# Patient Record
Sex: Female | Born: 1990 | Race: White | Hispanic: No | Marital: Single | Smoking: Current every day smoker
Health system: Southern US, Community
[De-identification: ages and names within clinical notes are randomized; demographics above are authoritative.]

## PROBLEM LIST (undated history)

## (undated) DIAGNOSIS — Z8659 Personal history of other mental and behavioral disorders: Secondary | ICD-10-CM

## (undated) DIAGNOSIS — F329 Major depressive disorder, single episode, unspecified: Secondary | ICD-10-CM

## (undated) DIAGNOSIS — G43909 Migraine, unspecified, not intractable, without status migrainosus: Secondary | ICD-10-CM

## (undated) DIAGNOSIS — F32A Depression, unspecified: Secondary | ICD-10-CM

## (undated) DIAGNOSIS — F419 Anxiety disorder, unspecified: Secondary | ICD-10-CM

## (undated) HISTORY — DX: Anxiety disorder, unspecified: F41.9

## (undated) HISTORY — DX: Personal history of other mental and behavioral disorders: Z86.59

## (undated) HISTORY — DX: Major depressive disorder, single episode, unspecified: F32.9

## (undated) HISTORY — DX: Migraine, unspecified, not intractable, without status migrainosus: G43.909

## (undated) HISTORY — DX: Depression, unspecified: F32.A

---

## 2011-01-14 ENCOUNTER — Inpatient Hospital Stay (HOSPITAL_COMMUNITY)
Admission: EM | Admit: 2011-01-14 | Discharge: 2011-01-16 | DRG: 641 | Disposition: A | Discharge status: Home / Self Care | Payer: Managed Care, Other (non HMO) | Attending: Internal Medicine | Admitting: Internal Medicine

## 2011-01-14 DIAGNOSIS — R55 Syncope and collapse: Secondary | ICD-10-CM | POA: Diagnosis present

## 2011-01-14 DIAGNOSIS — R630 Anorexia: Secondary | ICD-10-CM | POA: Diagnosis present

## 2011-01-14 DIAGNOSIS — E860 Dehydration: Principal | ICD-10-CM | POA: Diagnosis present

## 2011-01-14 DIAGNOSIS — F1210 Cannabis abuse, uncomplicated: Secondary | ICD-10-CM | POA: Diagnosis present

## 2011-01-14 DIAGNOSIS — K219 Gastro-esophageal reflux disease without esophagitis: Secondary | ICD-10-CM | POA: Diagnosis present

## 2011-01-14 DIAGNOSIS — R000 Tachycardia, unspecified: Secondary | ICD-10-CM | POA: Diagnosis present

## 2011-01-14 DIAGNOSIS — R632 Polyphagia: Secondary | ICD-10-CM | POA: Diagnosis present

## 2011-01-14 DIAGNOSIS — E876 Hypokalemia: Secondary | ICD-10-CM | POA: Diagnosis present

## 2011-01-14 DIAGNOSIS — F17200 Nicotine dependence, unspecified, uncomplicated: Secondary | ICD-10-CM | POA: Diagnosis present

## 2011-01-14 DIAGNOSIS — F339 Major depressive disorder, recurrent, unspecified: Secondary | ICD-10-CM | POA: Diagnosis present

## 2011-01-14 DIAGNOSIS — N390 Urinary tract infection, site not specified: Secondary | ICD-10-CM | POA: Diagnosis present

## 2011-01-14 LAB — BASIC METABOLIC PANEL
Calcium: 8.8 mg/dL (ref 8.4–10.5)
Creatinine, Ser: 0.81 mg/dL (ref 0.4–1.2)
GFR calc Af Amer: >60 mL/min (ref >60)
GFR calc non Af Amer: >60 mL/min (ref >60)
Glucose, Bld: 97 mg/dL (ref 70–99)
Sodium: 132 mEq/L — ABNORMAL LOW (ref 135–145)

## 2011-01-14 LAB — DIFFERENTIAL
Basophils Absolute: 0 10*3/uL (ref 0.0–0.1)
Basophils Relative: 0 % (ref 0–1)
Eosinophils Relative: 0 % (ref 0–5)
Monocytes Absolute: 0.7 10*3/uL (ref 0.1–1.0)

## 2011-01-14 LAB — URINALYSIS, ROUTINE W REFLEX MICROSCOPIC
Bilirubin Urine: NEGATIVE
Nitrite: NEGATIVE
Protein, ur: 30 mg/dL — AB
Specific Gravity, Urine: 1.028 (ref 1.005–1.030)
Urobilinogen, UA: 1 mg/dL (ref 0.0–1.0)

## 2011-01-14 LAB — URINE MICROSCOPIC-ADD ON

## 2011-01-14 LAB — CBC
MCH: 32.2 pg (ref 26.0–34.0)
MCHC: 36.6 g/dL — ABNORMAL HIGH (ref 30.0–36.0)
RDW: 11.8 % (ref 11.5–15.5)

## 2011-01-14 LAB — D-DIMER, QUANTITATIVE: D-Dimer, Quant: 0.36 ug/mL-FEU (ref 0.00–0.48)

## 2011-01-15 ENCOUNTER — Emergency Department (HOSPITAL_COMMUNITY): Payer: Managed Care, Other (non HMO)

## 2011-01-15 ENCOUNTER — Encounter (HOSPITAL_COMMUNITY): Payer: Self-pay

## 2011-01-15 DIAGNOSIS — R55 Syncope and collapse: Secondary | ICD-10-CM

## 2011-01-15 DIAGNOSIS — F331 Major depressive disorder, recurrent, moderate: Secondary | ICD-10-CM

## 2011-01-15 LAB — RAPID URINE DRUG SCREEN, HOSP PERFORMED
Amphetamines: NOT DETECTED
Opiates: NOT DETECTED
Tetrahydrocannabinol: POSITIVE — AB

## 2011-01-15 LAB — URINE CULTURE
Colony Count: NO GROWTH
Culture: NO GROWTH

## 2011-01-15 LAB — CARDIAC PANEL(CRET KIN+CKTOT+MB+TROPI)
Relative Index: INVALID (ref 0.0–2.5)
Total CK: 81 U/L (ref 7–177)
Total CK: 76 U/L (ref 7–177)
Troponin I: 0.03 ng/mL (ref 0.00–0.06)

## 2011-01-15 LAB — COMPREHENSIVE METABOLIC PANEL
ALT: 14 U/L (ref 0–35)
AST: 27 U/L (ref 0–37)
Albumin: 2.4 g/dL — ABNORMAL LOW (ref 3.5–5.2)
Alkaline Phosphatase: 49 U/L (ref 39–117)
CO2: 28 mEq/L (ref 19–32)
Chloride: 101 mEq/L (ref 96–112)
GFR calc Af Amer: >60 mL/min (ref >60)
GFR calc non Af Amer: >60 mL/min (ref >60)
Potassium: 2.4 mEq/L — CL (ref 3.5–5.1)
Sodium: 133 mEq/L — ABNORMAL LOW (ref 135–145)
Total Bilirubin: 0.4 mg/dL (ref 0.3–1.2)

## 2011-01-15 LAB — CK TOTAL AND CKMB (NOT AT ARMC)
CK, MB: 0.4 ng/mL (ref 0.3–4.0)
Relative Index: INVALID (ref 0.0–2.5)
Total CK: 62 U/L (ref 7–177)

## 2011-01-15 LAB — CBC
Hemoglobin: 10.5 g/dL — ABNORMAL LOW (ref 12.0–15.0)
MCH: 32.3 pg (ref 26.0–34.0)
Platelets: 131 10*3/uL — ABNORMAL LOW (ref 150–400)
RBC: 3.25 MIL/uL — ABNORMAL LOW (ref 3.87–5.11)
WBC: 3 10*3/uL — ABNORMAL LOW (ref 4.0–10.5)

## 2011-01-15 LAB — TSH: TSH: 0.773 u[IU]/mL (ref 0.350–4.500)

## 2011-01-15 LAB — POTASSIUM: Potassium: 3.2 mEq/L — ABNORMAL LOW (ref 3.5–5.1)

## 2011-01-16 LAB — BASIC METABOLIC PANEL
BUN: 3 mg/dL — ABNORMAL LOW (ref 6–23)
CO2: 26 mEq/L (ref 19–32)
Calcium: 7.3 mg/dL — ABNORMAL LOW (ref 8.4–10.5)
Creatinine, Ser: 0.55 mg/dL (ref 0.4–1.2)
GFR calc non Af Amer: >60 mL/min (ref >60)
Glucose, Bld: 103 mg/dL — ABNORMAL HIGH (ref 70–99)
Sodium: 135 mEq/L (ref 135–145)

## 2011-01-16 LAB — CBC
Hemoglobin: 10.7 g/dL — ABNORMAL LOW (ref 12.0–15.0)
MCH: 31.4 pg (ref 26.0–34.0)
MCV: 88.9 fL (ref 78.0–100.0)
Platelets: 119 10*3/uL — ABNORMAL LOW (ref 150–400)
RBC: 3.41 MIL/uL — ABNORMAL LOW (ref 3.87–5.11)
WBC: 5 10*3/uL (ref 4.0–10.5)

## 2011-01-19 NOTE — Consult Note (Signed)
  Jenny Watts, ROSSA           ACCOUNT NO.:  [id number]  MEDICAL RECORD NO.:  [id number]           PATIENT TYPE:  I  LOCATION:  TC08                         FACILITY:  [facility]  PHYSICIAN:  Eulogio Ditch, MD DATE OF BIRTH:  07/28/1991  DATE OF CONSULTATION:  01/15/2011 DATE OF DISCHARGE:                                CONSULTATION   REASON FOR CONSULTATION:  History of depression.  HISTORY OF PRESENT ILLNESS:  A 20 year old white female with a long history of depression who was admitted to the medical floor because of syncope, UTI and hypokalemia.  The patient told me that she is on Celexa for 3 years.  Earlier she was on 20 and then it was increased to 40 mg. It is prescribed by the primary care physician.  The patient denied any previous psychiatric hospitalization or any history of suicide attempt. The patient told me that recently she is stressed out because she separated from her boyfriend.  She was with this boyfriend for the last approximately 2 years, but both were unhappy in this relationship so they are separated at this time.  The patient also reported stress at work and school.  She was going to school for [place], but not going at this time.  The patient also abuses marijuana, but not regularly, on and off.  She denies the use of any other drugs.  Drinks alcohol socially. The patient got counseling in the past but at this time she is not getting any counseling.  The patient lives with parents and a brother.  MENTAL STATUS EXAM:  Calm and cooperative during the interview.  Fair eye contact.  No psychomotor agitation or retardation noted during the interview.  Speech is soft and slow.  Mood is depressed.  Affect is mood- congruent.  Thought process logical and goal-directed.  Thought content not suicidal or homicidal, not delusional.  Thought perception, no audiovisual hallucination reported.  Not internally preoccupied. Cognition alert, awake and oriented x3.   Memory immediate, recent and remote intact.  Fund of knowledge fair.  Attention and concentration fair.  Abstraction ability good.  Insight and judgment intact.  DIAGNOSES:  AXIS I:  Major depressive disorder, recurrent type. AXIS II:  Deferred. AXIS III:  See medical notes. AXIS IV:  Chronic mental issues. AXIS V:  50.  RECOMMENDATIONS: 1. The patient can be continued on Celexa 40 mg p.o. daily. 2. I advised the patient to get counseling in the outpatient setting. 3. I will follow up on this patient as needed on the medical floor. 4. This patient does not need to be admitted to BSE in the inpatient     setting at this time. 5. Mother agrees with the treatment plan. 6. I advised the patient not to abuse marijuana.     Eulogio Ditch, MD     SA/MEDQ  D:  01/15/2011  T:  01/15/2011  Job:  [id number]  Electronically Signed by Eulogio Ditch  on 01/19/2011 06:08:25 AM

## 2011-01-23 NOTE — Discharge Summary (Signed)
Jenny Watts, Jenny Watts           ACCOUNT NO.:  [id number]  MEDICAL RECORD NO.:  [id number]           PATIENT TYPE:  I  LOCATION:  TC08                         FACILITY:  [facility]  PHYSICIAN:  Thad Ranger, MD       DATE OF BIRTH:  1991/04/27  DATE OF ADMISSION:  01/14/2011 DATE OF DISCHARGE:  01/16/2011                              DISCHARGE SUMMARY   PRIMARY CARE PHYSICIAN:  [facility].  DISCHARGE DIAGNOSES: 1. Syncopal episode. 2. Hypokalemia. 3. Hypomagnesemia. 4. Tachycardia. 5. History of depression with bulimia anorexia. 6. Gastroesophageal reflux disease. 7. Cannabis abuse. 8. Urinary tract infection. 9. Dehydration.  CONSULTATIONS:  Psychiatry, Eulogio Ditch, MD  MEDICATIONS:  At the time of discharge, 1. Ciprofloxacin 500 mg p.o. b.i.d. for 2 days. 2. Magnesium oxide 400 mg p.o. b.i.d. for 7 days. 3. Potassium chloride 40 mEq p.o. daily for 3 days. 4. Protonix 40 mg p.o. daily. 5. Citalopram 40 mg p.o. daily at bedtime. 6. Multivitamin 1 tablet p.o. daily at bedtime.  BRIEF HISTORY OF PRESENT ILLNESS:  At the time of admission, Ms. Ibsen is a 20 year old female with history of depression, apparently went to work, sit up for a minute, looked at her boss, and then had a syncopal episode.  The patient felt lightheaded prior to the syncope and she knew that she was going to syncopize.  For details, please refer to the admission note dictated by Dr. Pearson Grippe on January 14, 2011.  Also at the time of admission, the patient was noted to have extremely low potassium as well as UTI.  RADIOLOGICAL DATA:  Chest x-ray, February 15th, no acute findings.  CT head without contrast, February 15th, normal study.  Echocardiogram, February 15th, EF of 55% to 60%, normal wall motion, no regional wall motion abnormalities.  PERTINENT LABORATORY AND DIAGNOSTIC DATA:  Urine pregnancy test negative.  CBC at the time of admission was essentially  unremarkable. UA showed more than 80 ketones,, 7-10 WBCs with many bacteria.  BMET at the time of admission showed potassium less than 2, sodium 132, BUN 10, creatinine 0.8, magnesium was 1.6.  D-dimer 0.36.  Alcohol level less than 5.  Urine drug screen showed positive tetrahydrocannabinol.  TSH 0.773.  Urine culture showed no growth.  BMET at the time of the discharge, sodium has improved to 135, potassium 3.0 which is being replaced, magnesium 1.4 which is also being replaced.  BRIEF HOSPITALIZATION COURSE:  Ms. Milhoan is a 20 year old female who presented with syncopal episode. 1. Syncope, multifactorial, likely secondary to dehydration and     vasovagal, electrolyte abnormalities.  The patient was admitted to     the tele monitor and ruled out for acute ACS.  CT of the brain was     done which showed normal study.  Echocardiogram also essentially     was normal.  The patient did have multiple abnormalities which     included hypomagnesemia and hypokalemia which was aggressively     replaced.  The patient was strongly counseled on not using any     diuretics or laxatives for her bulimia. 2. Depression with bulimia and anorexia.  Psychiatry consultation was     obtained and the patient was continued on Celexa.  The patient was     strongly counseled on not using the laxatives and diuretics over     the counter and also see outpatient psychiatry. 3. UTI.  The patient was placed on ciprofloxacin and should continue     for 2 more days.  PHYSICAL EXAMINATION:  GENERAL:  The patient is alert, awake, and oriented x3, not in any acute distress. HEENT:  Anicteric sclerae and conjunctivae.  Pupil reactive to light and accommodation.  EOMI. NECK:  Supple.  No lymphopathy.  No JVD. CVS:  S1 and S2 clear. CHEST:  Clear to auscultation bilaterally. ABDOMEN:  Soft, nontender, nondistended, normal bowel sounds. EXTREMITIES:  No cyanosis, clubbing, or edema noted in upper or  lower extremities bilaterally.  Discharge follow up with [facility] within next week and also get a BMET level next week with her followup.  Vital signs,  stable at the time of discharge.  DISCHARGE TIME:  35 minutes.     Thad Ranger, MD     RR/MEDQ  D:  01/16/2011  T:  01/16/2011  Job:  [id number]  cc:   [facility]  Electronically Signed by RIPUDEEP RAI  on 01/23/2011 12:43:14 PM

## 2011-02-18 NOTE — H&P (Signed)
Jenny Watts, Jenny Watts           ACCOUNT NO.:  [id number]  MEDICAL RECORD NO.:  [id number]           PATIENT TYPE:  E  LOCATION:  WLED                         FACILITY:  [facility]  PHYSICIAN:  Massie Maroon, MD        DATE OF BIRTH:  1991-08-27  DATE OF ADMISSION:  01/14/2011 DATE OF DISCHARGE:                             HISTORY & PHYSICAL   CHIEF COMPLAINT:  "I just was not feeling well."  HISTORY OF PRESENT ILLNESS:  This is a 20 year old female with a history of depression, apparently went to work, stood up for a minute, looked at her boss and then apparently had an episode of syncope.  She felt lightheaded prior to syncope and she knew that she was going to syncopize.  Apparently, she may have hit her head or at least she was told so by her colleagues.  There is no sign or evidence of laceration at the present time.  The patient is alert and oriented x3.  The patient denied any presyncopal episodes other than being lightheaded such as chest pain, palpitations, shortness of breath, nausea or vomiting.  The patient has noted some slight diarrhea over the last 4 days.  She also admits to making herself vomit.  The patient came to the ER for evaluation.  CT brain is pending.  The patient will be admitted for workup of syncope.  Of note, potassium was extremely low and this may have had a role in her episode of syncope as well as the fact that she has a urinary tract infection.  The patient will be admitted for syncope, UTI and hypokalemia.  PAST MEDICAL HISTORY: 1. Depression. 2. GERD.  PAST SURGICAL HISTORY:  None.  SOCIAL HISTORY:  Cannabis abuse, smoker of 1 pack per day for 2 years. She occasionally drinks.  She last drank on Friday.  She works at [employer].  FAMILY HISTORY:  Mother is alive at age 76 and has depression.  Father is alive at age 4 and healthy.  One brother is alive and healthy.  REVIEW OF SYSTEMS:  Positive for drinking alcohol on Friday,  otherwise negative for all 10 organ systems except for pertinent positives stated above.  ALLERGIES:  No known drug allergies.  MEDICATIONS: 1. Citalopram 40 mg p.o. daily. 2. Multivitamin 1 p.o. daily.  REVIEW OF SYSTEMS:  Negative for all 10 organ systems except for pertinent positives stated above.  PHYSICAL EXAMINATION:  VITAL SIGNS:  Temperature 98.3, pulse 104, blood pressure 105/71, pulse oximetry 97% on room air. HEENT:  Anicteric, EOMI, no nystagmus.  Pupils 1.5 mm, symmetric. Direct consensual near reflexes intact.  Mucous membranes moist. NECK:  No JVD, no bruit, no thyromegaly, no adenopathy. HEART:  Regular rate and rhythm.  S1 and S2.  No murmurs, gallops or rubs. LUNGS:  Clear to auscultation bilaterally. ABDOMEN:  Soft, nontender, nondistended.  Positive bowel sounds. EXTREMITIES:  No cyanosis, clubbing or edema. SKIN:  No rashes. LYMPH NODES:  No adenopathy. NEUROLOGIC:  Nonfocal.  Cranial nerves II through XII intact.  Reflexes 2+, symmetric, diffuse with downgoing toes bilaterally, motor strength 5/5 in all 4 extremities, pinprick intact.  SKIN:  Slight reddish marks around the nail beds indicative of possibly trying to make herself vomit.  LABORATORY DATA:  Sodium 132, potassium less than 2, chloride 81, bicarb 39, BUN 10, creatinine 0.81.  Urinalysis, WBC 7-10.  WBCs 5.7, hemoglobin 13.0, platelet count 181, urine pregnancy test is negative.  ASSESSMENT: 1. Syncope. 2. Hypokalemia. 3. Tachycardia. 4. Tobacco dependence. 5. Gastroesophageal reflux disease. 6. Cannabis abuse. 7. Depression.  PLAN:  The patient will be placed on telemetry.  Because of syncope, will check CK, CK-MB, troponin-I q.6 hours x3 sets along with carotid ultrasound, cardiac 2-D echo and a CT of the brain.  Have a D-dimer to the ED labs and if positive, CT angio, chest.  Rule out PE.  We will also check a CT brain, noncontrast.  The patient will be started on Cipro 500 mg p.o.  b.i.d. for UTI.  We will replete her potassium.  Check a magnesium and if low, replete her magnesium as this is needed to reabsorb potassium.  Obviously, she is instructed not to make herself throw up.  Continue citalopram 40 mg p.o. daily.  Continue multivitamins.  DVT prophylaxis, SCDs.     Massie Maroon, MD     JYK/MEDQ  D:  01/14/2011  T:  01/14/2011  Job:  [id number]  Electronically Signed by Pearson Grippe MD on 02/18/2011 09:52:42 PM

## 2011-08-08 IMAGING — CT CT HEAD W/O CM
1 series · 16 of 30 positions shown, 20 images · non-contrast
Comparison: None.

CLINICAL DATA: Syncope.

CT HEAD WITHOUT CONTRAST
TECHNIQUE: Contiguous axial images were obtained from the base of
the skull through the vertex without contrast.

[Series 2: headseq 4.8 h45s · axial · 0.43mm/px · z∈[-121,+7]mm · 16 of 30 slices shown, 20 images]
[im 2/30  brain]
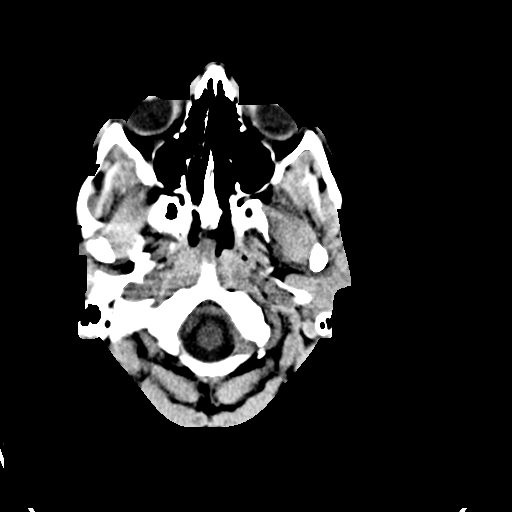
[im 2/30  bone]
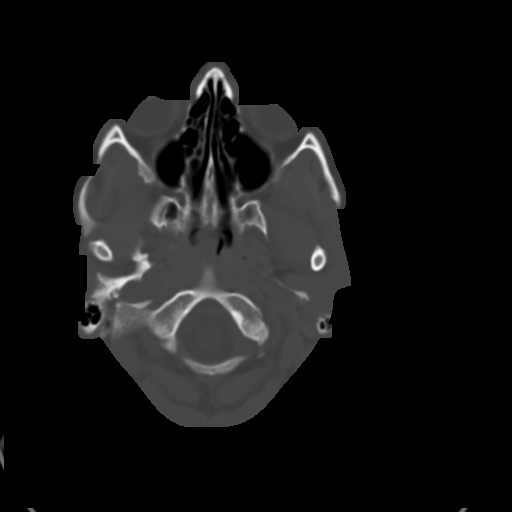
[im 4/30  brain]
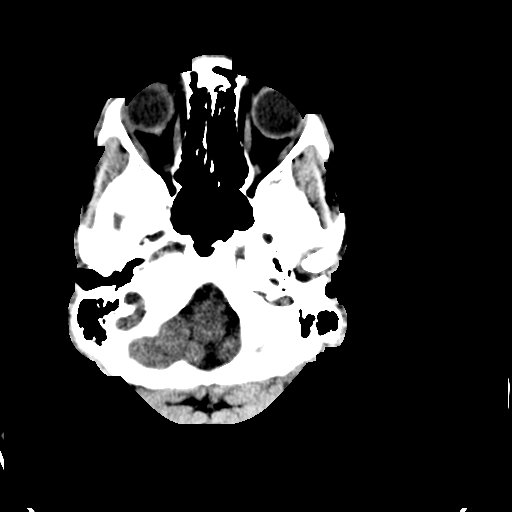
[im 6/30  brain]
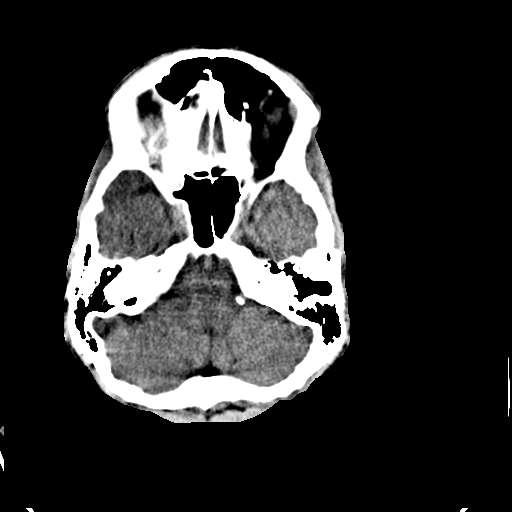
[im 8/30  brain]
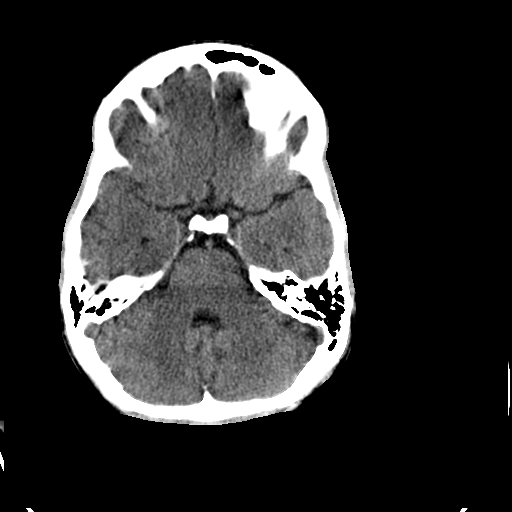
[im 9/30  brain]
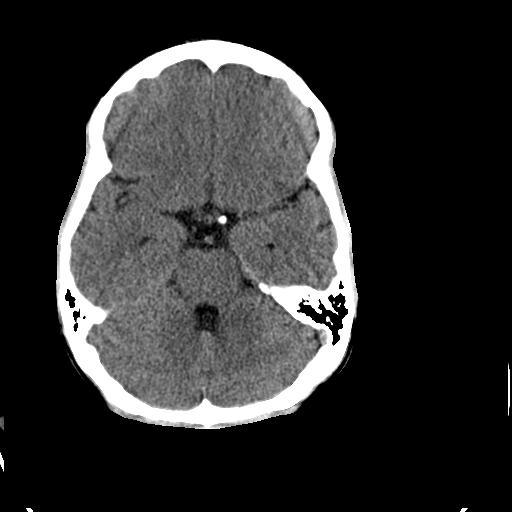
[im 9/30  bone]
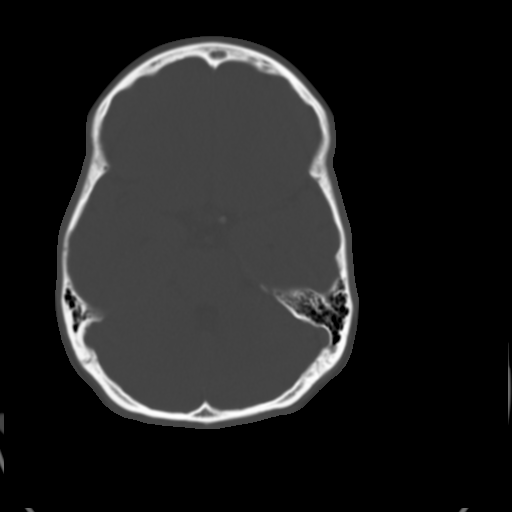
[im 11/30  brain]
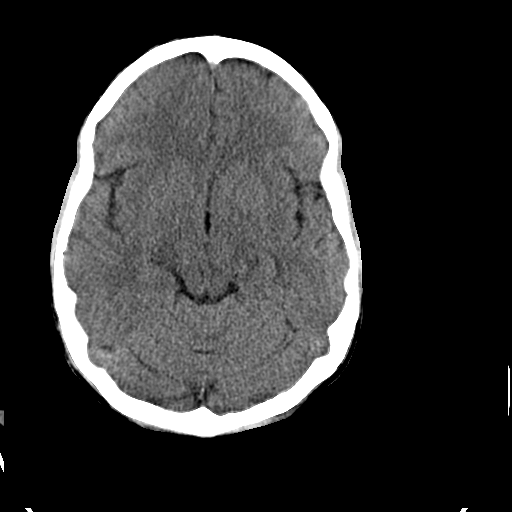
[im 13/30  brain]
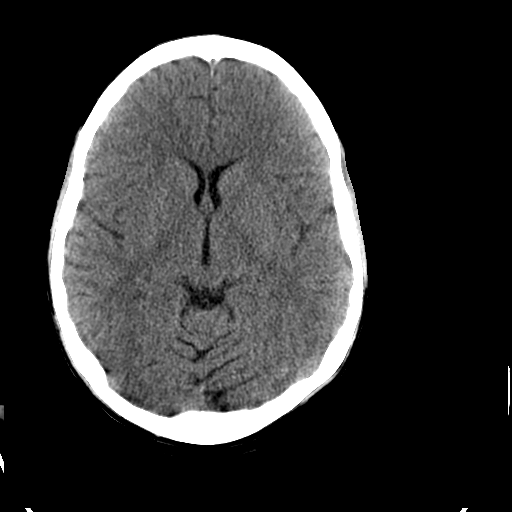
[im 15/30  brain]
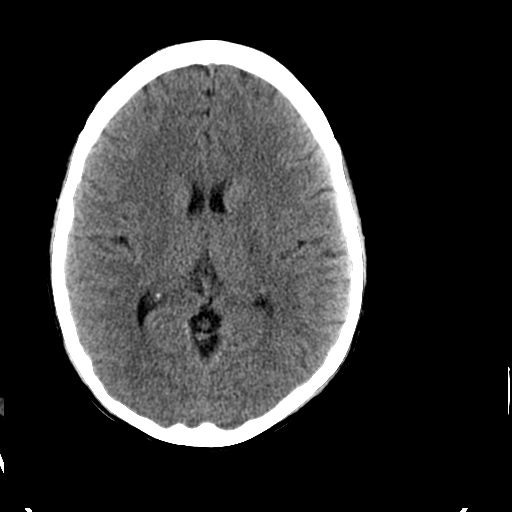
[im 16/30  brain]
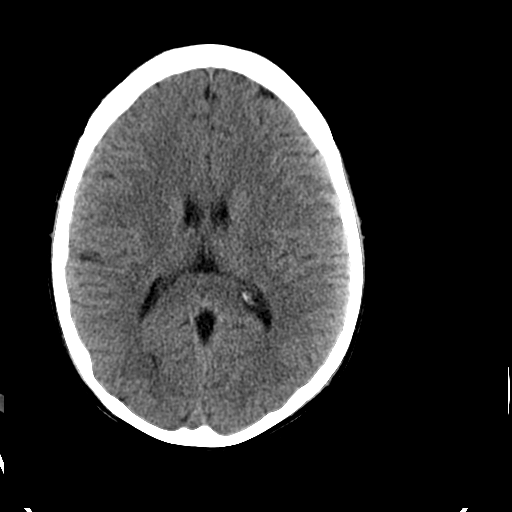
[im 16/30  bone]
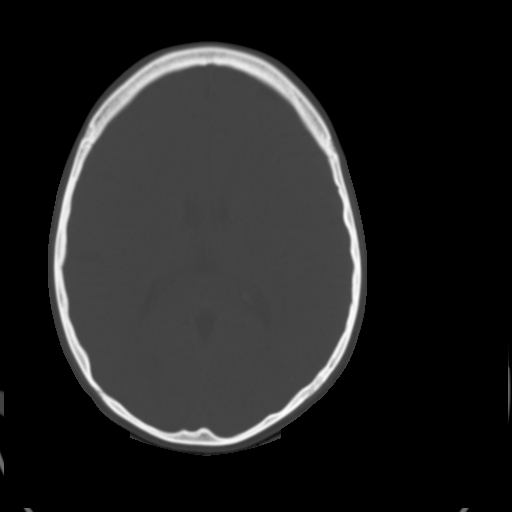
[im 18/30  brain]
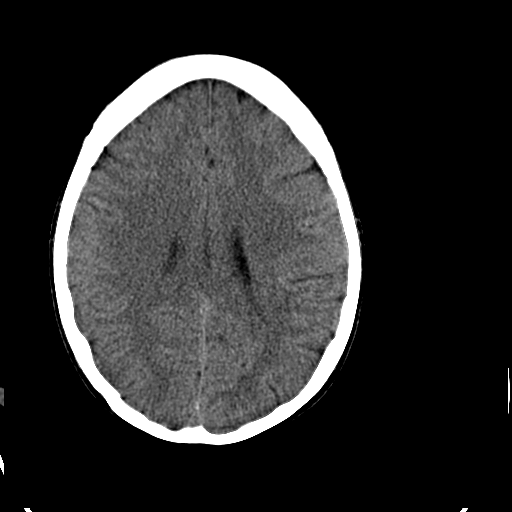
[im 20/30  brain]
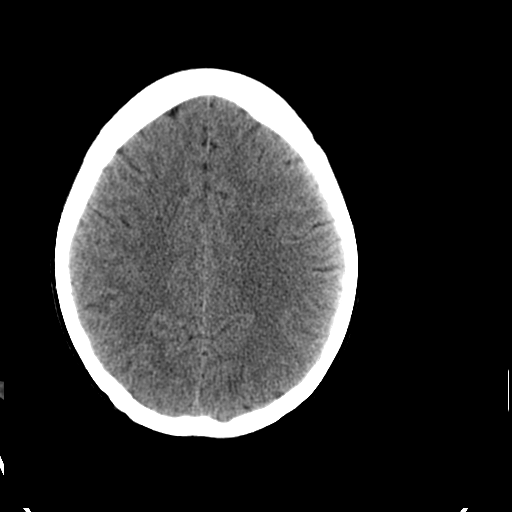
[im 22/30  brain]
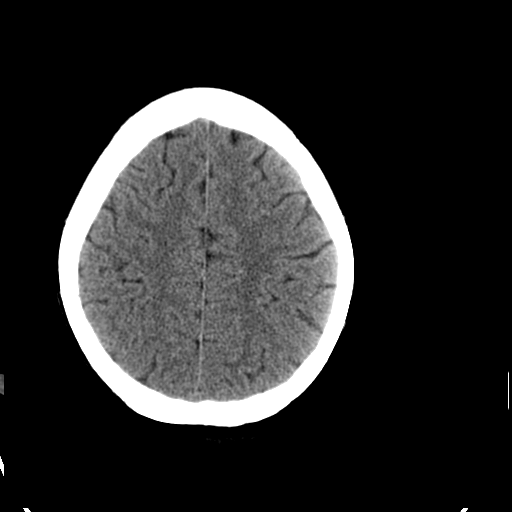
[im 23/30  brain]
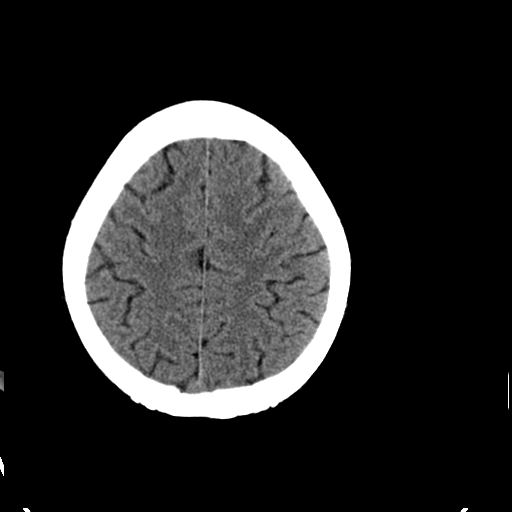
[im 23/30  bone]
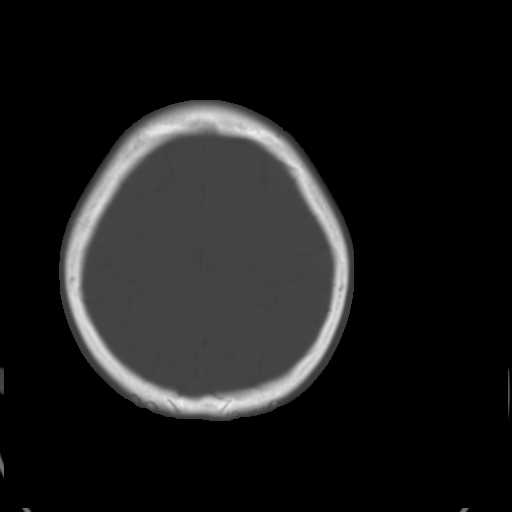
[im 25/30  brain]
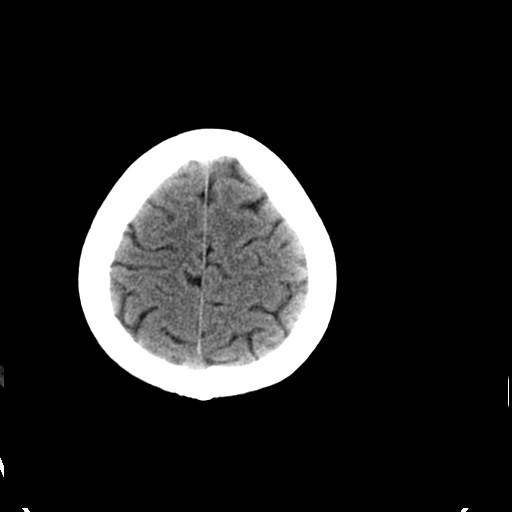
[im 27/30  brain]
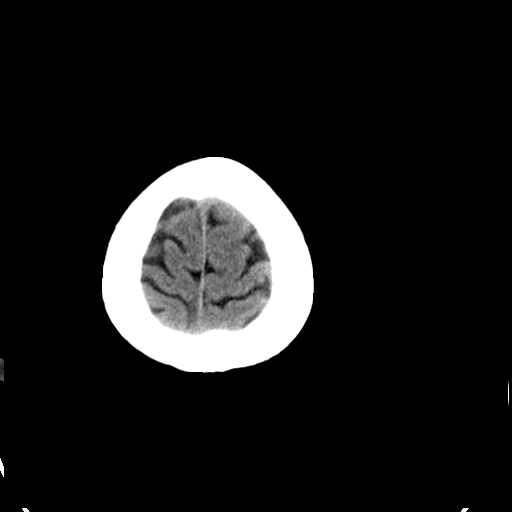
[im 29/30  brain]
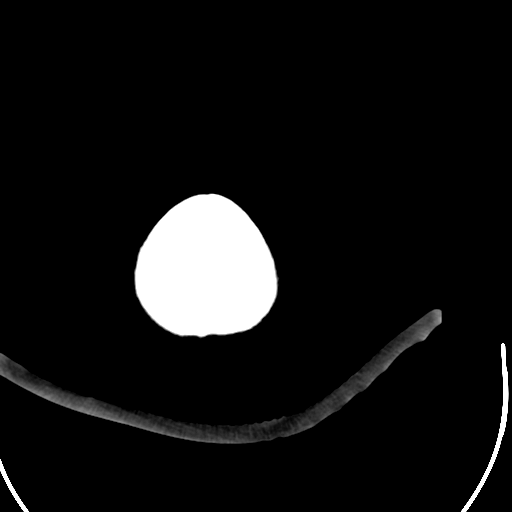

[16 of 30 positions shown; findings below may reference images not displayed]

FINDINGS: The brain appears normal without evidence of acute
infarction, hemorrhage, mass lesion, mass effect, midline shift or
abnormal extra-axial fluid collection.  No hydrocephalus.  Imaged
paranasal sinuses and mastoid air cells are clear.  Calvarium
intact.
IMPRESSION: Normal study.

## 2014-11-14 ENCOUNTER — Ambulatory Visit (INDEPENDENT_AMBULATORY_CARE_PROVIDER_SITE_OTHER): Payer: Managed Care, Other (non HMO) | Admitting: Family Medicine

## 2014-11-14 ENCOUNTER — Encounter: Payer: Self-pay | Admitting: Family Medicine

## 2014-11-14 VITALS — BP 119/83 | HR 104 | Ht 60.0 in | Wt 109.6 lb

## 2014-11-14 DIAGNOSIS — F324 Major depressive disorder, single episode, in partial remission: Secondary | ICD-10-CM

## 2014-11-14 DIAGNOSIS — R519 Headache, unspecified: Secondary | ICD-10-CM | POA: Insufficient documentation

## 2014-11-14 DIAGNOSIS — Z8659 Personal history of other mental and behavioral disorders: Secondary | ICD-10-CM

## 2014-11-14 DIAGNOSIS — Z124 Encounter for screening for malignant neoplasm of cervix: Secondary | ICD-10-CM

## 2014-11-14 DIAGNOSIS — Z113 Encounter for screening for infections with a predominantly sexual mode of transmission: Secondary | ICD-10-CM

## 2014-11-14 DIAGNOSIS — Z01419 Encounter for gynecological examination (general) (routine) without abnormal findings: Secondary | ICD-10-CM

## 2014-11-14 DIAGNOSIS — N926 Irregular menstruation, unspecified: Secondary | ICD-10-CM

## 2014-11-14 DIAGNOSIS — F325 Major depressive disorder, single episode, in full remission: Secondary | ICD-10-CM

## 2014-11-14 DIAGNOSIS — R51 Headache: Secondary | ICD-10-CM

## 2014-11-14 DIAGNOSIS — F419 Anxiety disorder, unspecified: Secondary | ICD-10-CM

## 2014-11-14 LAB — POCT URINE PREGNANCY: PREG TEST UR: NEGATIVE

## 2014-11-14 MED ORDER — NORGESTIMATE-ETH ESTRADIOL 0.25-35 MG-MCG PO TABS: 1.0000 | ORAL_TABLET | Freq: Every day | ORAL | Status: DC

## 2014-11-14 NOTE — Progress Notes (Signed)
Patient is here for her first pap exam.  She is interested in starting birth control to help control her cycles and has had some increased white discharge that she would like to have checked out.  She is also requesting a pregnancy test due to her last cycle was extremely light, lasting three days when they are usually 5-8 days.

## 2014-11-14 NOTE — Assessment & Plan Note (Signed)
Under more stress lately--continue Celexa--may return to see [name] if needed.

## 2014-11-14 NOTE — Progress Notes (Signed)
Subjective:     Jenny Watts is a 23 y.o. female and is here for a comprehensive physical exam. The patient reports problems - irregular cycles.  Has h/o amenorrhea related to eating disorder.  Now cycles are every 5 wks and last a full week.  She is sexually active. One female partner. Reports increased headaches lately, but under more stress. Interested in contraception for birth control and for regulation of cycles.  She uses condoms regularly.   History   Social History  . Marital Status: Single    Spouse Name: N/A    Number of Children: N/A  . Years of Education: N/A   Occupational History  . Not on file.   Social History Main Topics  . Smoking status: Current Every Day Smoker -- 0.25 packs/day for 6 years    Types: Cigarettes  . Smokeless tobacco: Never Used  . Alcohol Use: 0.0 oz/week    0 Not specified per week     Comment: weekends  . Drug Use: No  . Sexual Activity:    Partners: Male    Birth Control/ Protection: Condom   Other Topics Concern  . Not on file   Social History Narrative   Period Cycle (Days): 35 Period Duration (Days): 7 d Period Pattern: (!) Irregular Dysmenorrhea: (!) Moderate  The following portions of the patient's history were reviewed and updated as appropriate: allergies, current medications, past family history, past medical history, past social history, past surgical history and problem list.  Review of Systems A comprehensive review of systems was negative.   Objective:    BP 119/83 mmHg  Pulse 104  Ht 5' (1.524 m)  Wt 109 lb 9.6 oz (49.714 kg)  BMI 21.40 kg/m2  LMP 10/31/2014 (Approximate) General appearance: alert, cooperative and appears stated age Head: Normocephalic, without obvious abnormality, atraumatic Neck: no adenopathy, supple, symmetrical, trachea midline and thyroid not enlarged, symmetric, no tenderness/mass/nodules Lungs: clear to auscultation bilaterally Breasts: normal appearance, no masses or  tenderness Heart: regular rate and rhythm, S1, S2 normal, no murmur, click, rub or gallop Abdomen: soft, non-tender; bowel sounds normal; no masses,  no organomegaly Pelvic: cervix normal in appearance, external genitalia normal, no adnexal masses or tenderness, no cervical motion tenderness, uterus normal size, shape, and consistency and vagina normal without discharge Extremities: extremities normal, atraumatic, no cyanosis or edema Pulses: 2+ and symmetric Skin: Skin color, texture, turgor normal. No rashes or lesions Lymph nodes: Cervical, supraclavicular, and axillary nodes normal. Neurologic: Grossly normal    Assessment:    Healthy female exam.      Plan:   Problem List Items Addressed This Visit      Unprioritized   Depression, major, in remission   Relevant Medications      citalopram (CELEXA) 40 MG tablet   Worsening headaches    Under more stress lately--continue Celexa--may return to see [name] if needed.    Relevant Medications      citalopram (CELEXA) 40 MG tablet   Anxiety   Relevant Medications      citalopram (CELEXA) 40 MG tablet   History of anorexia nervosa    Other Visit Diagnoses    Screening for malignant neoplasm of cervix    -  Primary    Relevant Orders       Cytology - PAP       HIV antibody       RPR    Irregular menstrual bleeding        Relevant Medications  Sprintec 1 tab PO daily    Other Relevant Orders       POCT urine pregnancy (Completed)         See After Visit Summary for Counseling Recommendations

## 2014-11-14 NOTE — Patient Instructions (Signed)
Contraception Choices Contraception (birth control) is the use of any methods or devices to prevent pregnancy. Below are some methods to help avoid pregnancy. HORMONAL METHODS   Contraceptive implant. This is a thin, plastic tube containing progesterone hormone. It does not contain estrogen hormone. Your health care provider inserts the tube in the inner part of the upper arm. The tube can remain in place for up to 3 years. After 3 years, the implant must be removed. The implant prevents the ovaries from releasing an egg (ovulation), thickens the cervical mucus to prevent sperm from entering the uterus, and thins the lining of the inside of the uterus.  Progesterone-only injections. These injections are given every 3 months by your health care provider to prevent pregnancy. This synthetic progesterone hormone stops the ovaries from releasing eggs. It also thickens cervical mucus and changes the uterine lining. This makes it harder for sperm to survive in the uterus.  Birth control pills. These pills contain estrogen and progesterone hormone. They work by preventing the ovaries from releasing eggs (ovulation). They also cause the cervical mucus to thicken, preventing the sperm from entering the uterus. Birth control pills are prescribed by a health care provider.Birth control pills can also be used to treat heavy periods.  Minipill. This type of birth control pill contains only the progesterone hormone. They are taken every day of each month and must be prescribed by your health care provider.  Birth control patch. The patch contains hormones similar to those in birth control pills. It must be changed once a week and is prescribed by a health care provider.  Vaginal ring. The ring contains hormones similar to those in birth control pills. It is left in the vagina for 3 weeks, removed for 1 week, and then a new one is put back in place. The patient must be comfortable inserting and removing the ring  from the vagina.A health care provider's prescription is necessary.  Emergency contraception. Emergency contraceptives prevent pregnancy after unprotected sexual intercourse. This pill can be taken right after sex or up to 5 days after unprotected sex. It is most effective the sooner you take the pills after having sexual intercourse. Most emergency contraceptive pills are available without a prescription. Check with your pharmacist. Do not use emergency contraception as your only form of birth control. BARRIER METHODS   Female condom. This is a thin sheath (latex or rubber) that is worn over the penis during sexual intercourse. It can be used with spermicide to increase effectiveness.  Female condom. This is a soft, loose-fitting sheath that is put into the vagina before sexual intercourse.  Diaphragm. This is a soft, latex, dome-shaped barrier that must be fitted by a health care provider. It is inserted into the vagina, along with a spermicidal jelly. It is inserted before intercourse. The diaphragm should be left in the vagina for 6 to 8 hours after intercourse.  Cervical cap. This is a round, soft, latex or plastic cup that fits over the cervix and must be fitted by a health care provider. The cap can be left in place for up to 48 hours after intercourse.  Sponge. This is a soft, circular piece of polyurethane foam. The sponge has spermicide in it. It is inserted into the vagina after wetting it and before sexual intercourse.  Spermicides. These are chemicals that kill or block sperm from entering the cervix and uterus. They come in the form of creams, jellies, suppositories, foam, or tablets. They do not require a   prescription. They are inserted into the vagina with an applicator before having sexual intercourse. The process must be repeated every time you have sexual intercourse. INTRAUTERINE CONTRACEPTION  Intrauterine device (IUD). This is a T-shaped device that is put in a woman's uterus  during a menstrual period to prevent pregnancy. There are 2 types:  Copper IUD. This type of IUD is wrapped in copper wire and is placed inside the uterus. Copper makes the uterus and fallopian tubes produce a fluid that kills sperm. It can stay in place for 10 years.  Hormone IUD. This type of IUD contains the hormone progestin (synthetic progesterone). The hormone thickens the cervical mucus and prevents sperm from entering the uterus, and it also thins the uterine lining to prevent implantation of a fertilized egg. The hormone can weaken or kill the sperm that get into the uterus. It can stay in place for 3-5 years, depending on which type of IUD is used. PERMANENT METHODS OF CONTRACEPTION  Female tubal ligation. This is when the woman's fallopian tubes are surgically sealed, tied, or blocked to prevent the egg from traveling to the uterus.  Hysteroscopic sterilization. This involves placing a small coil or insert into each fallopian tube. Your doctor uses a technique called hysteroscopy to do the procedure. The device causes scar tissue to form. This results in permanent blockage of the fallopian tubes, so the sperm cannot fertilize the egg. It takes about 3 months after the procedure for the tubes to become blocked. You must use another form of birth control for these 3 months.  Female sterilization. This is when the female has the tubes that carry sperm tied off (vasectomy).This blocks sperm from entering the vagina during sexual intercourse. After the procedure, the man can still ejaculate fluid (semen). NATURAL PLANNING METHODS  Natural family planning. This is not having sexual intercourse or using a barrier method (condom, diaphragm, cervical cap) on days the woman could become pregnant.  Calendar method. This is keeping track of the length of each menstrual cycle and identifying when you are fertile.  Ovulation method. This is avoiding sexual intercourse during ovulation.  Symptothermal  method. This is avoiding sexual intercourse during ovulation, using a thermometer and ovulation symptoms.  Post-ovulation method. This is timing sexual intercourse after you have ovulated. Regardless of which type or method of contraception you choose, it is important that you use condoms to protect against the transmission of sexually transmitted infections (STIs). Talk with your health care provider about which form of contraception is most appropriate for you. Document Released: 11/17/2005 Document Revised: 11/22/2013 Document Reviewed: 05/12/2013 [employer] Patient Information 2015 [employer]. This information is not intended to replace advice given to you by your health care provider. Make sure you discuss any questions you have with your health care provider. Preventive Care for Adults A healthy lifestyle and preventive care can promote health and wellness. Preventive health guidelines for women include the following key practices.  A routine yearly physical is a good way to check with your health care provider about your health and preventive screening. It is a chance to share any concerns and updates on your health and to receive a thorough exam.  Visit your dentist for a routine exam and preventive care every 6 months. Brush your teeth twice a day and floss once a day. Good oral hygiene prevents tooth decay and gum disease.  The frequency of eye exams is based on your age, health, family medical history, use of contact lenses, and other   factors. Follow your health care provider's recommendations for frequency of eye exams.  Eat a healthy diet. Foods like vegetables, fruits, whole grains, low-fat dairy products, and lean protein foods contain the nutrients you need without too many calories. Decrease your intake of foods high in solid fats, added sugars, and salt. Eat the right amount of calories for you.Get information about a proper diet from your health care provider, if  necessary.  Regular physical exercise is one of the most important things you can do for your health. Most adults should get at least 150 minutes of moderate-intensity exercise (any activity that increases your heart rate and causes you to sweat) each week. In addition, most adults need muscle-strengthening exercises on 2 or more days a week.  Maintain a healthy weight. The body mass index (BMI) is a screening tool to identify possible weight problems. It provides an estimate of body fat based on height and weight. Your health care provider can find your BMI and can help you achieve or maintain a healthy weight.For adults 20 years and older:  A BMI below 18.5 is considered underweight.  A BMI of 18.5 to 24.9 is normal.  A BMI of 25 to 29.9 is considered overweight.  A BMI of 30 and above is considered obese.  Maintain normal blood lipids and cholesterol levels by exercising and minimizing your intake of saturated fat. Eat a balanced diet with plenty of fruit and vegetables. Blood tests for lipids and cholesterol should begin at age 20 and be repeated every 5 years. If your lipid or cholesterol levels are high, you are over 50, or you are at high risk for heart disease, you may need your cholesterol levels checked more frequently.Ongoing high lipid and cholesterol levels should be treated with medicines if diet and exercise are not working.  If you smoke, find out from your health care provider how to quit. If you do not use tobacco, do not start.  Lung cancer screening is recommended for adults aged 55-80 years who are at high risk for developing lung cancer because of a history of smoking. A yearly low-dose CT scan of the lungs is recommended for people who have at least a 30-pack-year history of smoking and are a current smoker or have quit within the past 15 years. A pack year of smoking is smoking an average of 1 pack of cigarettes a day for 1 year (for example: 1 pack a day for 30 years or 2  packs a day for 15 years). Yearly screening should continue until the smoker has stopped smoking for at least 15 years. Yearly screening should be stopped for people who develop a health problem that would prevent them from having lung cancer treatment.  If you are pregnant, do not drink alcohol. If you are breastfeeding, be very cautious about drinking alcohol. If you are not pregnant and choose to drink alcohol, do not have more than 1 drink per day. One drink is considered to be 12 ounces (355 mL) of beer, 5 ounces (148 mL) of wine, or 1.5 ounces (44 mL) of liquor.  Avoid use of street drugs. Do not share needles with anyone. Ask for help if you need support or instructions about stopping the use of drugs.  High blood pressure causes heart disease and increases the risk of stroke. Your blood pressure should be checked at least every 1 to 2 years. Ongoing high blood pressure should be treated with medicines if weight loss and exercise do not work.    If you are 55-79 years old, ask your health care provider if you should take aspirin to prevent strokes.  Diabetes screening involves taking a blood sample to check your fasting blood sugar level. This should be done once every 3 years, after age 45, if you are within normal weight and without risk factors for diabetes. Testing should be considered at a younger age or be carried out more frequently if you are overweight and have at least 1 risk factor for diabetes.  Breast cancer screening is essential preventive care for women. You should practice "breast self-awareness." This means understanding the normal appearance and feel of your breasts and may include breast self-examination. Any changes detected, no matter how small, should be reported to a health care provider. Women in their 20s and 30s should have a clinical breast exam (CBE) by a health care provider as part of a regular health exam every 1 to 3 years. After age 40, women should have a CBE every  year. Starting at age 40, women should consider having a mammogram (breast X-ray test) every year. Women who have a family history of breast cancer should talk to their health care provider about genetic screening. Women at a high risk of breast cancer should talk to their health care providers about having an MRI and a mammogram every year.  Breast cancer gene (BRCA)-related cancer risk assessment is recommended for women who have family members with BRCA-related cancers. BRCA-related cancers include breast, ovarian, tubal, and peritoneal cancers. Having family members with these cancers may be associated with an increased risk for harmful changes (mutations) in the breast cancer genes BRCA1 and BRCA2. Results of the assessment will determine the need for genetic counseling and BRCA1 and BRCA2 testing.  Routine pelvic exams to screen for cancer are no longer recommended for nonpregnant women who are considered low risk for cancer of the pelvic organs (ovaries, uterus, and vagina) and who do not have symptoms. Ask your health care provider if a screening pelvic exam is right for you.  If you have had past treatment for cervical cancer or a condition that could lead to cancer, you need Pap tests and screening for cancer for at least 20 years after your treatment. If Pap tests have been discontinued, your risk factors (such as having a new sexual partner) need to be reassessed to determine if screening should be resumed. Some women have medical problems that increase the chance of getting cervical cancer. In these cases, your health care provider may recommend more frequent screening and Pap tests.  The HPV test is an additional test that may be used for cervical cancer screening. The HPV test looks for the virus that can cause the cell changes on the cervix. The cells collected during the Pap test can be tested for HPV. The HPV test could be used to screen women aged 30 years and older, and should be used in  women of any age who have unclear Pap test results. After the age of 30, women should have HPV testing at the same frequency as a Pap test.  Colorectal cancer can be detected and often prevented. Most routine colorectal cancer screening begins at the age of 50 years and continues through age 75 years. However, your health care provider may recommend screening at an earlier age if you have risk factors for colon cancer. On a yearly basis, your health care provider may provide home test kits to check for hidden blood in the stool. Use of a   small camera at the end of a tube, to directly examine the colon (sigmoidoscopy or colonoscopy), can detect the earliest forms of colorectal cancer. Talk to your health care provider about this at age 50, when routine screening begins. Direct exam of the colon should be repeated every 5-10 years through age 75 years, unless early forms of pre-cancerous polyps or small growths are found.  People who are at an increased risk for hepatitis B should be screened for this virus. You are considered at high risk for hepatitis B if:  You were born in a country where hepatitis B occurs often. Talk with your health care provider about which countries are considered high risk.  Your parents were born in a high-risk country and you have not received a shot to protect against hepatitis B (hepatitis B vaccine).  You have HIV or AIDS.  You use needles to inject street drugs.  You live with, or have sex with, someone who has hepatitis B.  You get hemodialysis treatment.  You take certain medicines for conditions like cancer, organ transplantation, and autoimmune conditions.  Hepatitis C blood testing is recommended for all people born from 1945 through 1965 and any individual with known risks for hepatitis C.  Practice safe sex. Use condoms and avoid high-risk sexual practices to reduce the spread of sexually transmitted infections (STIs). STIs include gonorrhea, chlamydia,  syphilis, trichomonas, herpes, HPV, and human immunodeficiency virus (HIV). Herpes, HIV, and HPV are viral illnesses that have no cure. They can result in disability, cancer, and death.  You should be screened for sexually transmitted illnesses (STIs) including gonorrhea and chlamydia if:  You are sexually active and are younger than 24 years.  You are older than 24 years and your health care provider tells you that you are at risk for this type of infection.  Your sexual activity has changed since you were last screened and you are at an increased risk for chlamydia or gonorrhea. Ask your health care provider if you are at risk.  If you are at risk of being infected with HIV, it is recommended that you take a prescription medicine daily to prevent HIV infection. This is called preexposure prophylaxis (PrEP). You are considered at risk if:  You are a heterosexual woman, are sexually active, and are at increased risk for HIV infection.  You take drugs by injection.  You are sexually active with a partner who has HIV.  Talk with your health care provider about whether you are at high risk of being infected with HIV. If you choose to begin PrEP, you should first be tested for HIV. You should then be tested every 3 months for as long as you are taking PrEP.  Osteoporosis is a disease in which the bones lose minerals and strength with aging. This can result in serious bone fractures or breaks. The risk of osteoporosis can be identified using a bone density scan. Women ages 65 years and over and women at risk for fractures or osteoporosis should discuss screening with their health care providers. Ask your health care provider whether you should take a calcium supplement or vitamin D to reduce the rate of osteoporosis.  Menopause can be associated with physical symptoms and risks. Hormone replacement therapy is available to decrease symptoms and risks. You should talk to your health care provider  about whether hormone replacement therapy is right for you.  Use sunscreen. Apply sunscreen liberally and repeatedly throughout the day. You should seek shade when your shadow   is shorter than you. Protect yourself by wearing long sleeves, pants, a wide-brimmed hat, and sunglasses year round, whenever you are outdoors.  Once a month, do a whole body skin exam, using a mirror to look at the skin on your back. Tell your health care provider of new moles, moles that have irregular borders, moles that are larger than a pencil eraser, or moles that have changed in shape or color.  Stay current with required vaccines (immunizations).  Influenza vaccine. All adults should be immunized every year.  Tetanus, diphtheria, and acellular pertussis (Td, Tdap) vaccine. Pregnant women should receive 1 dose of Tdap vaccine during each pregnancy. The dose should be obtained regardless of the length of time since the last dose. Immunization is preferred during the 27th-36th week of gestation. An adult who has not previously received Tdap or who does not know her vaccine status should receive 1 dose of Tdap. This initial dose should be followed by tetanus and diphtheria toxoids (Td) booster doses every 10 years. Adults with an unknown or incomplete history of completing a 3-dose immunization series with Td-containing vaccines should begin or complete a primary immunization series including a Tdap dose. Adults should receive a Td booster every 10 years.  Varicella vaccine. An adult without evidence of immunity to varicella should receive 2 doses or a second dose if she has previously received 1 dose. Pregnant females who do not have evidence of immunity should receive the first dose after pregnancy. This first dose should be obtained before leaving the health care facility. The second dose should be obtained 4-8 weeks after the first dose.  Human papillomavirus (HPV) vaccine. Females aged 13-26 years who have not received  the vaccine previously should obtain the 3-dose series. The vaccine is not recommended for use in pregnant females. However, pregnancy testing is not needed before receiving a dose. If a female is found to be pregnant after receiving a dose, no treatment is needed. In that case, the remaining doses should be delayed until after the pregnancy. Immunization is recommended for any person with an immunocompromised condition through the age of 26 years if she did not get any or all doses earlier. During the 3-dose series, the second dose should be obtained 4-8 weeks after the first dose. The third dose should be obtained 24 weeks after the first dose and 16 weeks after the second dose.  Zoster vaccine. One dose is recommended for adults aged 60 years or older unless certain conditions are present.  Measles, mumps, and rubella (MMR) vaccine. Adults born before 1957 generally are considered immune to measles and mumps. Adults born in 1957 or later should have 1 or more doses of MMR vaccine unless there is a contraindication to the vaccine or there is laboratory evidence of immunity to each of the three diseases. A routine second dose of MMR vaccine should be obtained at least 28 days after the first dose for students attending postsecondary schools, health care workers, or international travelers. People who received inactivated measles vaccine or an unknown type of measles vaccine during 1963-1967 should receive 2 doses of MMR vaccine. People who received inactivated mumps vaccine or an unknown type of mumps vaccine before 1979 and are at high risk for mumps infection should consider immunization with 2 doses of MMR vaccine. For females of childbearing age, rubella immunity should be determined. If there is no evidence of immunity, females who are not pregnant should be vaccinated. If there is no evidence of immunity, females who   are pregnant should delay immunization until after pregnancy. Unvaccinated health care  workers born before 1957 who lack laboratory evidence of measles, mumps, or rubella immunity or laboratory confirmation of disease should consider measles and mumps immunization with 2 doses of MMR vaccine or rubella immunization with 1 dose of MMR vaccine.  Pneumococcal 13-valent conjugate (PCV13) vaccine. When indicated, a person who is uncertain of her immunization history and has no record of immunization should receive the PCV13 vaccine. An adult aged 19 years or older who has certain medical conditions and has not been previously immunized should receive 1 dose of PCV13 vaccine. This PCV13 should be followed with a dose of pneumococcal polysaccharide (PPSV23) vaccine. The PPSV23 vaccine dose should be obtained at least 8 weeks after the dose of PCV13 vaccine. An adult aged 19 years or older who has certain medical conditions and previously received 1 or more doses of PPSV23 vaccine should receive 1 dose of PCV13. The PCV13 vaccine dose should be obtained 1 or more years after the last PPSV23 vaccine dose.  Pneumococcal polysaccharide (PPSV23) vaccine. When PCV13 is also indicated, PCV13 should be obtained first. All adults aged 65 years and older should be immunized. An adult younger than age 65 years who has certain medical conditions should be immunized. Any person who resides in a nursing home or long-term care facility should be immunized. An adult smoker should be immunized. People with an immunocompromised condition and certain other conditions should receive both PCV13 and PPSV23 vaccines. People with human immunodeficiency virus (HIV) infection should be immunized as soon as possible after diagnosis. Immunization during chemotherapy or radiation therapy should be avoided. Routine use of PPSV23 vaccine is not recommended for American Indians, [place] Natives, or people younger than 65 years unless there are medical conditions that require PPSV23 vaccine. When indicated, people who have unknown  immunization and have no record of immunization should receive PPSV23 vaccine. One-time revaccination 5 years after the first dose of PPSV23 is recommended for people aged 19-64 years who have chronic kidney failure, nephrotic syndrome, asplenia, or immunocompromised conditions. People who received 1-2 doses of PPSV23 before age 65 years should receive another dose of PPSV23 vaccine at age 65 years or later if at least 5 years have passed since the previous dose. Doses of PPSV23 are not needed for people immunized with PPSV23 at or after age 65 years.  Meningococcal vaccine. Adults with asplenia or persistent complement component deficiencies should receive 2 doses of quadrivalent meningococcal conjugate (MenACWY-D) vaccine. The doses should be obtained at least 2 months apart. Microbiologists working with certain meningococcal bacteria, military recruits, people at risk during an outbreak, and people who travel to or live in countries with a high rate of meningitis should be immunized. A first-year college student up through age 21 years who is living in a residence hall should receive a dose if she did not receive a dose on or after her 16th birthday. Adults who have certain high-risk conditions should receive one or more doses of vaccine.  Hepatitis A vaccine. Adults who wish to be protected from this disease, have certain high-risk conditions, work with hepatitis A-infected animals, work in hepatitis A research labs, or travel to or work in countries with a high rate of hepatitis A should be immunized. Adults who were previously unvaccinated and who anticipate close contact with an international adoptee during the first 60 days after arrival in the [place] from a country with a high rate of hepatitis A should be   immunized.  Hepatitis B vaccine. Adults who wish to be protected from this disease, have certain high-risk conditions, may be exposed to blood or other infectious body fluids, are household  contacts or sex partners of hepatitis B positive people, are clients or workers in certain care facilities, or travel to or work in countries with a high rate of hepatitis B should be immunized.  Haemophilus influenzae type b (Hib) vaccine. A previously unvaccinated person with asplenia or sickle cell disease or having a scheduled splenectomy should receive 1 dose of Hib vaccine. Regardless of previous immunization, a recipient of a hematopoietic stem cell transplant should receive a 3-dose series 6-12 months after her successful transplant. Hib vaccine is not recommended for adults with HIV infection. Preventive Services / Frequency Ages 19 to 39 years  Blood pressure check.** / Every 1 to 2 years.  Lipid and cholesterol check.** / Every 5 years beginning at age 20.  Clinical breast exam.** / Every 3 years for women in their 20s and 30s.  BRCA-related cancer risk assessment.** / For women who have family members with a BRCA-related cancer (breast, ovarian, tubal, or peritoneal cancers).  Pap test.** / Every 2 years from ages 21 through 29. Every 3 years starting at age 30 through age 65 or 70 with a history of 3 consecutive normal Pap tests.  HPV screening.** / Every 3 years from ages 30 through ages 65 to 70 with a history of 3 consecutive normal Pap tests.  Hepatitis C blood test.** / For any individual with known risks for hepatitis C.  Skin self-exam. / Monthly.  Influenza vaccine. / Every year.  Tetanus, diphtheria, and acellular pertussis (Tdap, Td) vaccine.** / Consult your health care provider. Pregnant women should receive 1 dose of Tdap vaccine during each pregnancy. 1 dose of Td every 10 years.  Varicella vaccine.** / Consult your health care provider. Pregnant females who do not have evidence of immunity should receive the first dose after pregnancy.  HPV vaccine. / 3 doses over 6 months, if 26 and younger. The vaccine is not recommended for use in pregnant females. However,  pregnancy testing is not needed before receiving a dose.  Measles, mumps, rubella (MMR) vaccine.** / You need at least 1 dose of MMR if you were born in 1957 or later. You may also need a 2nd dose. For females of childbearing age, rubella immunity should be determined. If there is no evidence of immunity, females who are not pregnant should be vaccinated. If there is no evidence of immunity, females who are pregnant should delay immunization until after pregnancy.  Pneumococcal 13-valent conjugate (PCV13) vaccine.** / Consult your health care provider.  Pneumococcal polysaccharide (PPSV23) vaccine.** / 1 to 2 doses if you smoke cigarettes or if you have certain conditions.  Meningococcal vaccine.** / 1 dose if you are age 19 to 21 years and a first-year college student living in a residence hall, or have one of several medical conditions, you need to get vaccinated against meningococcal disease. You may also need additional booster doses.  Hepatitis A vaccine.** / Consult your health care provider.  Hepatitis B vaccine.** / Consult your health care provider.  Haemophilus influenzae type b (Hib) vaccine.** / Consult your health care provider. Ages 40 to 64 years  Blood pressure check.** / Every 1 to 2 years.  Lipid and cholesterol check.** / Every 5 years beginning at age 20 years.  Lung cancer screening. / Every year if you are aged 55-80 years and have a   30-pack-year history of smoking and currently smoke or have quit within the past 15 years. Yearly screening is stopped once you have quit smoking for at least 15 years or develop a health problem that would prevent you from having lung cancer treatment.  Clinical breast exam.** / Every year after age 40 years.  BRCA-related cancer risk assessment.** / For women who have family members with a BRCA-related cancer (breast, ovarian, tubal, or peritoneal cancers).  Mammogram.** / Every year beginning at age 40 years and continuing for as long  as you are in good health. Consult with your health care provider.  Pap test.** / Every 3 years starting at age 30 years through age 65 or 70 years with a history of 3 consecutive normal Pap tests.  HPV screening.** / Every 3 years from ages 30 years through ages 65 to 70 years with a history of 3 consecutive normal Pap tests.  Fecal occult blood test (FOBT) of stool. / Every year beginning at age 50 years and continuing until age 75 years. You may not need to do this test if you get a colonoscopy every 10 years.  Flexible sigmoidoscopy or colonoscopy.** / Every 5 years for a flexible sigmoidoscopy or every 10 years for a colonoscopy beginning at age 50 years and continuing until age 75 years.  Hepatitis C blood test.** / For all people born from 1945 through 1965 and any individual with known risks for hepatitis C.  Skin self-exam. / Monthly.  Influenza vaccine. / Every year.  Tetanus, diphtheria, and acellular pertussis (Tdap/Td) vaccine.** / Consult your health care provider. Pregnant women should receive 1 dose of Tdap vaccine during each pregnancy. 1 dose of Td every 10 years.  Varicella vaccine.** / Consult your health care provider. Pregnant females who do not have evidence of immunity should receive the first dose after pregnancy.  Zoster vaccine.** / 1 dose for adults aged 60 years or older.  Measles, mumps, rubella (MMR) vaccine.** / You need at least 1 dose of MMR if you were born in 1957 or later. You may also need a 2nd dose. For females of childbearing age, rubella immunity should be determined. If there is no evidence of immunity, females who are not pregnant should be vaccinated. If there is no evidence of immunity, females who are pregnant should delay immunization until after pregnancy.  Pneumococcal 13-valent conjugate (PCV13) vaccine.** / Consult your health care provider.  Pneumococcal polysaccharide (PPSV23) vaccine.** / 1 to 2 doses if you smoke cigarettes or if you  have certain conditions.  Meningococcal vaccine.** / Consult your health care provider.  Hepatitis A vaccine.** / Consult your health care provider.  Hepatitis B vaccine.** / Consult your health care provider.  Haemophilus influenzae type b (Hib) vaccine.** / Consult your health care provider. Ages 65 years and over  Blood pressure check.** / Every 1 to 2 years.  Lipid and cholesterol check.** / Every 5 years beginning at age 20 years.  Lung cancer screening. / Every year if you are aged 55-80 years and have a 30-pack-year history of smoking and currently smoke or have quit within the past 15 years. Yearly screening is stopped once you have quit smoking for at least 15 years or develop a health problem that would prevent you from having lung cancer treatment.  Clinical breast exam.** / Every year after age 40 years.  BRCA-related cancer risk assessment.** / For women who have family members with a BRCA-related cancer (breast, ovarian, tubal, or peritoneal cancers).    Mammogram.** / Every year beginning at age 40 years and continuing for as long as you are in good health. Consult with your health care provider.  Pap test.** / Every 3 years starting at age 30 years through age 65 or 70 years with 3 consecutive normal Pap tests. Testing can be stopped between 65 and 70 years with 3 consecutive normal Pap tests and no abnormal Pap or HPV tests in the past 10 years.  HPV screening.** / Every 3 years from ages 30 years through ages 65 or 70 years with a history of 3 consecutive normal Pap tests. Testing can be stopped between 65 and 70 years with 3 consecutive normal Pap tests and no abnormal Pap or HPV tests in the past 10 years.  Fecal occult blood test (FOBT) of stool. / Every year beginning at age 50 years and continuing until age 75 years. You may not need to do this test if you get a colonoscopy every 10 years.  Flexible sigmoidoscopy or colonoscopy.** / Every 5 years for a flexible  sigmoidoscopy or every 10 years for a colonoscopy beginning at age 50 years and continuing until age 75 years.  Hepatitis C blood test.** / For all people born from 1945 through 1965 and any individual with known risks for hepatitis C.  Osteoporosis screening.** / A one-time screening for women ages 65 years and over and women at risk for fractures or osteoporosis.  Skin self-exam. / Monthly.  Influenza vaccine. / Every year.  Tetanus, diphtheria, and acellular pertussis (Tdap/Td) vaccine.** / 1 dose of Td every 10 years.  Varicella vaccine.** / Consult your health care provider.  Zoster vaccine.** / 1 dose for adults aged 60 years or older.  Pneumococcal 13-valent conjugate (PCV13) vaccine.** / Consult your health care provider.  Pneumococcal polysaccharide (PPSV23) vaccine.** / 1 dose for all adults aged 65 years and older.  Meningococcal vaccine.** / Consult your health care provider.  Hepatitis A vaccine.** / Consult your health care provider.  Hepatitis B vaccine.** / Consult your health care provider.  Haemophilus influenzae type b (Hib) vaccine.** / Consult your health care provider. ** Family history and personal history of risk and conditions may change your health care provider's recommendations. Document Released: 01/13/2002 Document Revised: 04/03/2014 Document Reviewed: 04/14/2011 Exit[employer] Patient Information 2015 [employer]. This information is not intended to replace advice given to you by your health care provider. Make sure you discuss any questions you have with your health care provider.  

## 2014-11-15 LAB — RPR

## 2014-11-15 LAB — HIV ANTIBODY (ROUTINE TESTING W REFLEX): HIV: NONREACTIVE

## 2014-11-16 LAB — CYTOLOGY - PAP

## 2014-11-21 ENCOUNTER — Ambulatory Visit: Payer: Managed Care, Other (non HMO) | Admitting: Family Medicine

## 2015-05-03 ENCOUNTER — Encounter: Payer: Self-pay | Admitting: Radiology

## 2015-05-03 ENCOUNTER — Encounter: Payer: Self-pay | Admitting: Primary Care

## 2015-05-03 ENCOUNTER — Ambulatory Visit (INDEPENDENT_AMBULATORY_CARE_PROVIDER_SITE_OTHER): Payer: Managed Care, Other (non HMO) | Admitting: Primary Care

## 2015-05-03 VITALS — BP 118/84 | HR 111 | Temp 98.0°F | Ht 60.0 in | Wt 103.0 lb

## 2015-05-03 DIAGNOSIS — R51 Headache: Secondary | ICD-10-CM | POA: Diagnosis not present

## 2015-05-03 DIAGNOSIS — F329 Major depressive disorder, single episode, unspecified: Secondary | ICD-10-CM

## 2015-05-03 DIAGNOSIS — F418 Other specified anxiety disorders: Secondary | ICD-10-CM

## 2015-05-03 DIAGNOSIS — F419 Anxiety disorder, unspecified: Secondary | ICD-10-CM

## 2015-05-03 DIAGNOSIS — R519 Headache, unspecified: Secondary | ICD-10-CM

## 2015-05-03 MED ORDER — ALPRAZOLAM 0.25 MG PO TABS: 0.2500 mg | ORAL_TABLET | Freq: Two times a day (BID) | ORAL | Status: DC | PRN

## 2015-05-03 MED ORDER — CITALOPRAM HYDROBROMIDE 40 MG PO TABS: ORAL_TABLET | ORAL | Status: DC

## 2015-05-03 NOTE — Patient Instructions (Signed)
Start Citalopram tablets. Take 1/2 tablet by mouth daily for 6 days, then take 1 full tablet by mouth daily thereafter. You may take the Xanax as needed for anxiety. It was a pleasure to meet you today! Please don't hesitate to call me with any questions. Welcome to [facility]!  Follow up in 5 weeks for re-evaluation of anxiety.

## 2015-05-03 NOTE — Assessment & Plan Note (Signed)
Increased frequency likely due to stress and lack of medications. Will continue to monitor.

## 2015-05-03 NOTE — Progress Notes (Signed)
Subjective:    Patient ID: Jenny Watts, female    DOB: 01/02/1991, 24 y.o.   MRN: [id number]  HPI  [name] is a 24 year old female who presents today to establish care and discuss the problems mentioned below. Will obtain old records.  1) Anxiety and Depression: Diagnosed as a teenager. She's been taking Citalopram for the past 8 years, 40mg  for the past 3 years. She hasn't consistently taking her Citalopram over the past several months due to increased stress and belief that the medication was completely effective. She then restarted herself of the Citalopram and became nauseated so she decided to switch. She was switched to Lexapro 2 months ago. She reports an increase in headaches, anger outbursts, chest tightness, increased anxiety with the switch to lexapro. She has been off of the Lexapro for 2 weeks and has noticed a reduction in these symptoms. Denies SI/HI. She's been worrying frequently, experiencing irritability, chest tightness, anxiety, and sleeping too much or none at all. She is going through a lot of stress currently with the death of a family member and would like to restart her medication.   2) Migraines: Present for one year. She will experience them 3 times weekly and will take ibuprofen, tylenol, and essential oils for pain with some relief. She's going through a lot of family stress recently which has increased the frequency of headaches.  Review of Systems  Constitutional: Negative for unexpected weight change.  HENT: Negative for rhinorrhea.   Respiratory: Positive for chest tightness. Negative for cough and shortness of breath.   Cardiovascular: Negative for chest pain.  Gastrointestinal: Negative for diarrhea and constipation.  Genitourinary: Negative for dysuria and frequency.  Neurological: Positive for headaches. Negative for dizziness.  Psychiatric/Behavioral: Negative for suicidal ideas. The patient is nervous/anxious.        Past Medical History    Diagnosis Date  . Anxiety   . Depression   . History of eating disorder     resolved for two years  . Migraines     History   Social History  . Marital Status: Single    Spouse Name: N/A  . Number of Children: N/A  . Years of Education: N/A   Occupational History  . Not on file.   Social History Main Topics  . Smoking status: Current Every Day Smoker -- 0.25 packs/day for 6 years    Types: Cigarettes  . Smokeless tobacco: Never Used  . Alcohol Use: 0.0 oz/week    0 Standard drinks or equivalent per week     Comment: weekends  . Drug Use: No  . Sexual Activity:    Partners: Male    Birth Control/ Protection: Condom   Other Topics Concern  . Not on file   Social History Narrative   Single.   Highest level of education was [employer]   Works with Customer Service   Enjoys reading, being outdoors, hula-hoop.       History reviewed. No pertinent past surgical history.  Family History  Problem Relation Age of Onset  . Thyroid disease Mother   . Diabetes Maternal Grandmother   . Mental illness Paternal Grandmother     dimensia  . Heart disease Paternal Grandfather     No Known Allergies  Current Outpatient Prescriptions on File Prior to Visit  Medication Sig Dispense Refill  . norgestimate-ethinyl estradiol (ORTHO-CYCLEN,SPRINTEC,PREVIFEM) 0.25-35 MG-MCG tablet Take 1 tablet by mouth daily. 1 Package 11   No current facility-administered medications on file  prior to visit.    BP 118/84 mmHg  Pulse 111  Temp(Src) 98 F (36.7 C) (Oral)  Ht 5' (1.524 m)  Wt 103 lb (46.72 kg)  BMI 20.12 kg/m2  SpO2 97%    Objective:   Physical Exam  Constitutional: She is oriented to person, place, and time. She appears well-nourished.  HENT:  Head: Normocephalic.  Cardiovascular: Normal rate and regular rhythm.   Pulmonary/Chest: Effort normal and breath sounds normal.  Neurological: She is alert and oriented to person, place, and time.  Skin: Skin is warm and  dry.  Psychiatric: She has a normal mood and affect.          Assessment & Plan:

## 2015-05-03 NOTE — Progress Notes (Signed)
Pre visit review using our clinic review tool, if applicable. No additional management support is needed unless otherwise documented below in the visit note. 

## 2015-05-03 NOTE — Assessment & Plan Note (Signed)
Not well controlled.  Has been off of medication for past 2 weeks and feels very anxious. Restart Citalopram 40 mg. 1/2 tablet daily for 6 days then 1 full tablet thereafter. Provided script for Xanax to help bridge the gap until Citalopram can start effectives. UDS and contract obtained. Will evaluate in 5 weeks. If not at goal then will consider adding additional medication.

## 2015-06-13 ENCOUNTER — Ambulatory Visit: Payer: Managed Care, Other (non HMO) | Admitting: Primary Care

## 2015-06-27 ENCOUNTER — Ambulatory Visit (INDEPENDENT_AMBULATORY_CARE_PROVIDER_SITE_OTHER): Payer: Managed Care, Other (non HMO) | Admitting: Primary Care

## 2015-06-27 ENCOUNTER — Encounter: Payer: Self-pay | Admitting: Primary Care

## 2015-06-27 VITALS — BP 102/64 | HR 99 | Temp 98.1°F | Ht 60.0 in | Wt 102.1 lb

## 2015-06-27 DIAGNOSIS — F419 Anxiety disorder, unspecified: Secondary | ICD-10-CM

## 2015-06-27 NOTE — Progress Notes (Signed)
Pre visit review using our clinic review tool, if applicable. No additional management support is needed unless otherwise documented below in the visit note. 

## 2015-06-27 NOTE — Patient Instructions (Signed)
Follow up in 4 months for re-evaluation of depression.  It was nice to see you!

## 2015-06-27 NOTE — Progress Notes (Signed)
Subjective:    Patient ID: Jenny Watts, female    DOB: 1991/09/08, 24 y.o.   MRN: [id number]  HPI  [name] is a 24 year old female who presents today for follow up of anxiety. She was evaluated last visit and restarted on her Citalopram 40 mg tablets. During our last visit she had been out of her Citalopram for 2 months and had noticed a difference in her anxiety and depression. Since initiation she's feeling well managed. She does experience some difficulty falling asleep (not every night) which she will take Tylenol PM with relief. She continues to experience some headaches which have improved overall. Denies nausea, GI upset, SI/HI.   Review of Systems  Respiratory: Negative for shortness of breath.   Cardiovascular: Negative for chest pain.  Gastrointestinal: Negative for abdominal pain.  Neurological:       Overall improvement in headaches.  Psychiatric/Behavioral: Negative for suicidal ideas and sleep disturbance. The patient is not nervous/anxious.        Past Medical History  Diagnosis Date  . Anxiety   . Depression   . History of eating disorder     resolved for two years  . Migraines     History   Social History  . Marital Status: Single    Spouse Name: N/A  . Number of Children: N/A  . Years of Education: N/A   Occupational History  . Not on file.   Social History Main Topics  . Smoking status: Current Every Day Smoker -- 0.25 packs/day for 6 years    Types: Cigarettes  . Smokeless tobacco: Never Used  . Alcohol Use: 0.0 oz/week    0 Standard drinks or equivalent per week     Comment: weekends  . Drug Use: No  . Sexual Activity:    Partners: Male    Birth Control/ Protection: Condom   Other Topics Concern  . Not on file   Social History Narrative   Single.   Highest level of education was [employer]   Works with Customer Service   Enjoys reading, being outdoors, hula-hoop.       No past surgical history on file.  Family History    Problem Relation Age of Onset  . Thyroid disease Mother   . Diabetes Maternal Grandmother   . Mental illness Paternal Grandmother     dimensia  . Heart disease Paternal Grandfather     No Known Allergies  Current Outpatient Prescriptions on File Prior to Visit  Medication Sig Dispense Refill  . b complex vitamins capsule Take 1 capsule by mouth daily.    . Cholecalciferol 2000 UNITS CAPS Take 2 capsules by mouth.    . citalopram (CELEXA) 40 MG tablet Take 1/2 tablet by mouth daily for 6 days, then take 1 full tablet by mouth daily thereafter. 30 tablet 3  . Multiple Vitamins-Minerals (MULTIVITAMIN ADULT PO) Take 1 capsule by mouth daily.    . norgestimate-ethinyl estradiol (ORTHO-CYCLEN,SPRINTEC,PREVIFEM) 0.25-35 MG-MCG tablet Take 1 tablet by mouth daily. 1 Package 11  . zinc gluconate 50 MG tablet Take 50 mg by mouth daily.     No current facility-administered medications on file prior to visit.    BP 102/64 mmHg  Pulse 99  Temp(Src) 98.1 F (36.7 C) (Oral)  Ht 5' (1.524 m)  Wt 102 lb 1.9 oz (46.321 kg)  BMI 19.94 kg/m2  SpO2 98%    Objective:   Physical Exam  Constitutional: She is oriented to person, place, and time.  Cardiovascular: Normal rate and regular rhythm.   Pulmonary/Chest: Effort normal and breath sounds normal.  Neurological: She is alert and oriented to person, place, and time.  Skin: Skin is warm and dry.  Psychiatric: She has a normal mood and affect.          Assessment & Plan:

## 2015-06-27 NOTE — Assessment & Plan Note (Signed)
Improved since re initiation of citalopram . Denies SI/HI, GI upset, and headaches have overall improved. Will follow up in 4 months for re-evaluation.

## 2015-09-14 ENCOUNTER — Other Ambulatory Visit: Payer: Self-pay | Admitting: Primary Care

## 2015-09-14 DIAGNOSIS — F329 Major depressive disorder, single episode, unspecified: Secondary | ICD-10-CM

## 2015-09-14 DIAGNOSIS — F419 Anxiety disorder, unspecified: Principal | ICD-10-CM

## 2015-09-14 MED ORDER — CITALOPRAM HYDROBROMIDE 40 MG PO TABS: ORAL_TABLET | ORAL | Status: DC

## 2015-09-14 NOTE — Telephone Encounter (Signed)
Electronically refill request for    citalopram (CELEXA) 40 MG tablet  Take 1/2 tablet by mouth daily for 6 days, then take 1 full tablet by mouth daily thereafter.  Dispense: 30 tablet   Refills: 3    Last prescribed on 05/03/2015. Last seen for follow up on 06/27/2015. No future appointment.

## 2015-12-11 ENCOUNTER — Other Ambulatory Visit: Payer: Self-pay | Admitting: Family Medicine

## 2016-01-25 ENCOUNTER — Other Ambulatory Visit: Payer: Self-pay | Admitting: Primary Care

## 2016-01-26 NOTE — Telephone Encounter (Signed)
Electronically refill request for   citalopram (CELEXA) 40 MG tablet   Take 1 tablet by mouth daily.  Dispense: 30 tablet   Refills: 3     Last prescribed on 09/14/2015. Last seen on 06/27/15. No future appointment.

## 2016-06-17 ENCOUNTER — Other Ambulatory Visit: Payer: Self-pay | Admitting: Primary Care

## 2016-06-17 ENCOUNTER — Encounter: Payer: Self-pay | Admitting: Primary Care

## 2016-06-17 ENCOUNTER — Ambulatory Visit (INDEPENDENT_AMBULATORY_CARE_PROVIDER_SITE_OTHER): Payer: Managed Care, Other (non HMO) | Admitting: Primary Care

## 2016-06-17 VITALS — BP 96/66 | HR 71 | Temp 98.4°F | Ht 60.0 in | Wt 106.8 lb

## 2016-06-17 DIAGNOSIS — N9489 Other specified conditions associated with female genital organs and menstrual cycle: Secondary | ICD-10-CM

## 2016-06-17 DIAGNOSIS — R102 Pelvic and perineal pain: Secondary | ICD-10-CM

## 2016-06-17 LAB — POC URINALSYSI DIPSTICK (AUTOMATED)
Bilirubin, UA: NEGATIVE
GLUCOSE UA: NEGATIVE
Ketones, UA: NEGATIVE
Leukocytes, UA: NEGATIVE
Nitrite, UA: NEGATIVE
Protein, UA: NEGATIVE
SPEC GRAV UA: 1.015
UROBILINOGEN UA: NEGATIVE
pH, UA: 6

## 2016-06-17 NOTE — Progress Notes (Signed)
Pre visit review using our clinic review tool, if applicable. No additional management support is needed unless otherwise documented below in the visit note. 

## 2016-06-17 NOTE — Progress Notes (Signed)
Subjective:    Patient ID: Jenny Watts, female    DOB: August 14, 1991, 25 y.o.   MRN: [id number]  HPI  Ms. Ferrante is a 25 year old female who presents today with a chief complaint of pelvic pressure. She also reports lower back pain, urinary frequency, dysuria, vaginal discharge, low grade fever, chills. Her symptoms began 4 days ago and have not improved since. She's taken AZO and Advil with some improvement with symptoms. Denies nausea vomiting, abdominal pain, diarrhea, vaginal itching.   Review of Systems  Constitutional: Positive for chills. Negative for fever.  Gastrointestinal: Negative for nausea, vomiting, abdominal pain and diarrhea.  Genitourinary: Positive for dysuria, frequency, flank pain, vaginal discharge and pelvic pain. Negative for hematuria.       Past Medical History  Diagnosis Date  . Anxiety   . Depression   . History of eating disorder     resolved for two years  . Migraines      Social History   Social History  . Marital Status: Single    Spouse Name: N/A  . Number of Children: N/A  . Years of Education: N/A   Occupational History  . Not on file.   Social History Main Topics  . Smoking status: Current Every Day Smoker -- 0.25 packs/day for 6 years    Types: Cigarettes  . Smokeless tobacco: Never Used  . Alcohol Use: 0.0 oz/week    0 Standard drinks or equivalent per week     Comment: weekends  . Drug Use: No  . Sexual Activity:    Partners: Male    Birth Control/ Protection: Condom   Other Topics Concern  . Not on file   Social History Narrative   Single.   Highest level of education was [employer]   Works with Customer Service   Enjoys reading, being outdoors, hula-hoop.       No past surgical history on file.  Family History  Problem Relation Age of Onset  . Thyroid disease Mother   . Diabetes Maternal Grandmother   . Mental illness Paternal Grandmother     dimensia  . Heart disease Paternal Grandfather     No  Known Allergies  Current Outpatient Prescriptions on File Prior to Visit  Medication Sig Dispense Refill  . b complex vitamins capsule Take 1 capsule by mouth daily.    . Cholecalciferol 2000 UNITS CAPS Take 2 capsules by mouth.    . citalopram (CELEXA) 40 MG tablet TAKE 1 TABLET BY MOUTH DAILY. 90 tablet 1  . Multiple Vitamins-Minerals (MULTIVITAMIN ADULT PO) Take 1 capsule by mouth daily.    . SPRINTEC 28 0.25-35 MG-MCG tablet TAKE 1 TABLET BY MOUTH DAILY 28 tablet 6  . zinc gluconate 50 MG tablet Take 50 mg by mouth daily.     No current facility-administered medications on file prior to visit.    BP 96/66 mmHg  Pulse 71  Temp(Src) 98.4 F (36.9 C) (Oral)  Ht 5' (1.524 m)  Wt 106 lb 12.8 oz (48.444 kg)  BMI 20.86 kg/m2  SpO2 94%    Objective:   Physical Exam  Constitutional: She appears well-nourished.  Cardiovascular: Normal rate and regular rhythm.   Pulmonary/Chest: Effort normal and breath sounds normal.  Abdominal: Soft. Bowel sounds are normal. There is tenderness in the suprapubic area. There is no CVA tenderness.  Genitourinary: There is no tenderness on the right labia. There is no tenderness on the left labia. Cervix exhibits discharge. Cervix exhibits no motion  tenderness.  Minimal whitish discharge. No foul odor.  Skin: Skin is warm and dry.          Assessment & Plan:  Pelvic pressure:  Also with urinary frequency, vaginal discharge, dysuria 4 days. Some improvement with OTC AZO and ibuprofen. Exam with tenderness to suprapubic region, no CVA tenderness. Does not appear acutely ill. UA: Negative for leuks, nitrites. Trace blood. Culture sent as she is symptomatic. Pelvic exam with minimal whitish discharge, otherwise unremarkable. Wet prep obtained and sent off for testing. Discussed continued use of AZO and ibuprofen, hydrate with water. Will await wet prep results.  Morrie Sheldonlark,Oluwatamilore Starnes Kendal, NP

## 2016-06-17 NOTE — Patient Instructions (Signed)
Your urine test does not show evidence of infection.  Continue ibuprofen and AZO as needed for symptoms.  I will notify you of your vaginal specimen once received.  Ensure you are staying hydrated with water.  It was a pleasure to see you today!

## 2016-06-18 ENCOUNTER — Telehealth: Payer: Self-pay | Admitting: Primary Care

## 2016-06-18 NOTE — Telephone Encounter (Signed)
Please notify patient that I will call her as soon as I receive these results.

## 2016-06-18 NOTE — Telephone Encounter (Signed)
Pt called. Would like call with labs results.   Also wants Jenny Watts to know that she is having more pain today than she was yesterday. Pelvic pain, back pain and vagina. Pt is taking AZO pills and ibuprofen. Please advise.  .Marland Kitchen

## 2016-06-19 ENCOUNTER — Other Ambulatory Visit: Payer: Self-pay | Admitting: Primary Care

## 2016-06-19 DIAGNOSIS — R300 Dysuria: Secondary | ICD-10-CM

## 2016-06-19 LAB — WET PREP BY MOLECULAR PROBE
Candida species: NEGATIVE
GARDNERELLA VAGINALIS: NEGATIVE
Trichomonas vaginosis: NEGATIVE

## 2016-06-19 LAB — URINE CULTURE: ORGANISM ID, BACTERIA: NO GROWTH

## 2016-06-19 MED ORDER — PHENAZOPYRIDINE HCL 200 MG PO TABS: 200.0000 mg | ORAL_TABLET | Freq: Three times a day (TID) | ORAL | Status: DC | PRN

## 2016-06-19 NOTE — Telephone Encounter (Signed)
Patient viewed results and Kate's comments on MyChart.

## 2016-07-14 ENCOUNTER — Inpatient Hospital Stay (HOSPITAL_COMMUNITY): Admission: AD | Admit: 2016-07-14 | Payer: Managed Care, Other (non HMO) | Admitting: Obstetrics & Gynecology

## 2016-07-21 ENCOUNTER — Other Ambulatory Visit: Payer: Self-pay | Admitting: Primary Care

## 2016-07-21 NOTE — Telephone Encounter (Signed)
Ok to refill? Electronically refill request for   citalopram (CELEXA) 40 MG tablet  Last prescribed on 01/27/2016. Last seen on 06/17/2016. No future appt.

## 2016-07-23 ENCOUNTER — Other Ambulatory Visit: Payer: Self-pay | Admitting: Primary Care

## 2016-07-23 ENCOUNTER — Other Ambulatory Visit: Payer: Self-pay | Admitting: Family Medicine

## 2016-07-23 DIAGNOSIS — Z3041 Encounter for surveillance of contraceptive pills: Secondary | ICD-10-CM

## 2016-07-23 NOTE — Telephone Encounter (Signed)
Received faxed refill request for  Sprintec 28   Medication have not been prescribed by Jae DireKate. Last seen on 06/17/2016. No future appt

## 2016-07-24 ENCOUNTER — Encounter: Payer: Self-pay | Admitting: Primary Care

## 2016-07-24 MED ORDER — NORGESTIMATE-ETH ESTRADIOL 0.25-35 MG-MCG PO TABS: 1.0000 | ORAL_TABLET | Freq: Every day | ORAL | 1 refills | Status: DC

## 2017-01-05 ENCOUNTER — Other Ambulatory Visit: Payer: Self-pay | Admitting: Primary Care

## 2017-01-05 DIAGNOSIS — Z3041 Encounter for surveillance of contraceptive pills: Secondary | ICD-10-CM

## 2017-03-25 ENCOUNTER — Other Ambulatory Visit: Payer: Self-pay | Admitting: Primary Care

## 2017-03-25 DIAGNOSIS — Z3041 Encounter for surveillance of contraceptive pills: Secondary | ICD-10-CM

## 2017-04-28 ENCOUNTER — Other Ambulatory Visit: Payer: Self-pay | Admitting: Primary Care

## 2017-04-28 DIAGNOSIS — F411 Generalized anxiety disorder: Secondary | ICD-10-CM

## 2017-04-28 NOTE — Telephone Encounter (Signed)
Ok to refill? Electronically refill request for citalopram (CELEXA) 40 MG tablet. Last prescribed on 07/22/2016. Last seen on 06/17/2016

## 2017-04-28 NOTE — Telephone Encounter (Signed)
Please notify patient that she's due for follow up in July 2018. We will need to see her for any further refills.

## 2017-04-29 NOTE — Telephone Encounter (Signed)
Sent patient a message through My Chart.

## 2017-05-01 NOTE — Telephone Encounter (Signed)
Noted. Patient viewed message on MyChart.  

## 2017-05-20 ENCOUNTER — Ambulatory Visit (INDEPENDENT_AMBULATORY_CARE_PROVIDER_SITE_OTHER): Payer: Managed Care, Other (non HMO) | Admitting: Primary Care

## 2017-05-20 ENCOUNTER — Encounter: Payer: Self-pay | Admitting: Primary Care

## 2017-05-20 DIAGNOSIS — F419 Anxiety disorder, unspecified: Secondary | ICD-10-CM

## 2017-05-20 DIAGNOSIS — Z3041 Encounter for surveillance of contraceptive pills: Secondary | ICD-10-CM | POA: Diagnosis not present

## 2017-05-20 DIAGNOSIS — F411 Generalized anxiety disorder: Secondary | ICD-10-CM

## 2017-05-20 DIAGNOSIS — F325 Major depressive disorder, single episode, in full remission: Secondary | ICD-10-CM

## 2017-05-20 MED ORDER — NORGESTIMATE-ETH ESTRADIOL 0.25-35 MG-MCG PO TABS: 1.0000 | ORAL_TABLET | Freq: Every day | ORAL | 1 refills | Status: DC

## 2017-05-20 MED ORDER — CITALOPRAM HYDROBROMIDE 40 MG PO TABS: 40.0000 mg | ORAL_TABLET | Freq: Every day | ORAL | 3 refills | Status: DC

## 2017-05-20 NOTE — Assessment & Plan Note (Signed)
Doing well on citalopram 40 mg, refills provided today.

## 2017-05-20 NOTE — Progress Notes (Signed)
   Subjective:    Patient ID: Jenny Watts, female    DOB: 12/23/1990, 26 y.o.   MRN: [id number]  HPI  [name] is a 26 year old female who presents today for follow up.  1) Anxiety and Depression: Currently managed on citalopram 40 mg. She's doing well on the citalopram. She doesn't feel sadness, her anxiety has reduced, she's able to control her anxiety when she does feel anxious. She denies GI upset, major headaches, SI/HI. She is needing refills.  2) OCP Surveillance: LMP was one month ago today, she feels she about to start her cycle. She's not missed any pills. She denies menorrhagia and dysmenorrhea. She is needing a refill today. Her last Pap was in December 2015.  Review of Systems  Genitourinary: Negative for menstrual problem.  Psychiatric/Behavioral: Negative for sleep disturbance and suicidal ideas. The patient is not nervous/anxious.        See HPI       Past Medical History:  Diagnosis Date  . Anxiety   . Depression   . History of eating disorder    resolved for two years  . Migraines      Social History   Social History  . Marital status: Single    Spouse name: N/A  . Number of children: N/A  . Years of education: N/A   Occupational History  . Not on file.   Social History Main Topics  . Smoking status: Current Every Day Smoker    Packs/day: 0.25    Years: 6.00    Types: Cigarettes  . Smokeless tobacco: Never Used  . Alcohol use 0.0 oz/week     Comment: weekends  . Drug use: No  . Sexual activity: Yes    Partners: Male    Birth control/ protection: Condom   Other Topics Concern  . Not on file   Social History Narrative   Single.   Highest level of education was [employer]   Works with [employer]   Enjoys reading, being outdoors, hula-hoop.       No past surgical history on file.  Family History  Problem Relation Age of Onset  . Thyroid disease Mother   . Diabetes Maternal Grandmother   . Mental illness Paternal  Grandmother        dimensia  . Heart disease Paternal Grandfather     No Known Allergies  Current Outpatient Prescriptions on File Prior to Visit  Medication Sig Dispense Refill  . zinc gluconate 50 MG tablet Take 50 mg by mouth daily.     No current facility-administered medications on file prior to visit.     BP 100/64   Pulse 90   Temp 98.5 F (36.9 C) (Oral)   Ht 5' (1.524 m)   Wt 106 lb (48.1 kg)   LMP 04/17/2017   SpO2 99%   BMI 20.70 kg/m    Objective:   Physical Exam  Constitutional: She appears well-nourished.  Neck: Neck supple.  Cardiovascular: Normal rate and regular rhythm.   Pulmonary/Chest: Effort normal and breath sounds normal.  Skin: Skin is warm and dry.  Psychiatric: She has a normal mood and affect.          Assessment & Plan:

## 2017-05-20 NOTE — Assessment & Plan Note (Signed)
Doing well on Citalopram, denies SI/HI. Refills provided today.

## 2017-05-20 NOTE — Patient Instructions (Signed)
Schedule your Pap sometime in December 2018. We can make this a complete physical.  I sent refills for your Citalopram and Birth control pills.  It was a pleasure to see you today!

## 2017-05-20 NOTE — Assessment & Plan Note (Signed)
Doing well on Sprintec. Pap due in December 2018, she will schedule another appointment as she declines today. Refills sent for 6 month supply. Will complete Pap this Winter.

## 2017-07-30 ENCOUNTER — Ambulatory Visit: Payer: Managed Care, Other (non HMO) | Admitting: Internal Medicine

## 2017-07-30 ENCOUNTER — Ambulatory Visit: Payer: Managed Care, Other (non HMO) | Admitting: Primary Care

## 2017-07-31 ENCOUNTER — Ambulatory Visit: Payer: Managed Care, Other (non HMO) | Admitting: Primary Care

## 2017-10-06 ENCOUNTER — Encounter: Payer: Managed Care, Other (non HMO) | Admitting: Primary Care

## 2017-10-07 ENCOUNTER — Encounter: Payer: Self-pay | Admitting: Primary Care

## 2017-10-12 ENCOUNTER — Encounter: Payer: Self-pay | Admitting: Primary Care

## 2017-10-12 ENCOUNTER — Ambulatory Visit (INDEPENDENT_AMBULATORY_CARE_PROVIDER_SITE_OTHER): Payer: Managed Care, Other (non HMO) | Admitting: Primary Care

## 2017-10-12 ENCOUNTER — Other Ambulatory Visit (HOSPITAL_COMMUNITY)
Admission: RE | Admit: 2017-10-12 | Discharge: 2017-10-12 | Disposition: A | Discharge status: Home / Self Care | Payer: Managed Care, Other (non HMO) | Source: Ambulatory Visit | Attending: Primary Care | Admitting: Primary Care

## 2017-10-12 VITALS — BP 100/66 | HR 108 | Temp 98.9°F | Ht 60.0 in | Wt 105.8 lb

## 2017-10-12 DIAGNOSIS — F419 Anxiety disorder, unspecified: Secondary | ICD-10-CM

## 2017-10-12 DIAGNOSIS — R51 Headache: Secondary | ICD-10-CM

## 2017-10-12 DIAGNOSIS — F325 Major depressive disorder, single episode, in full remission: Secondary | ICD-10-CM | POA: Diagnosis not present

## 2017-10-12 DIAGNOSIS — R519 Headache, unspecified: Secondary | ICD-10-CM

## 2017-10-12 DIAGNOSIS — Z3041 Encounter for surveillance of contraceptive pills: Secondary | ICD-10-CM | POA: Diagnosis not present

## 2017-10-12 DIAGNOSIS — Z124 Encounter for screening for malignant neoplasm of cervix: Secondary | ICD-10-CM | POA: Diagnosis not present

## 2017-10-12 DIAGNOSIS — Z0000 Encounter for general adult medical examination without abnormal findings: Secondary | ICD-10-CM

## 2017-10-12 LAB — COMPREHENSIVE METABOLIC PANEL
ALK PHOS: 55 U/L (ref 39–117)
ALT: 11 U/L (ref 0–35)
AST: 20 U/L (ref 0–37)
Albumin: 4.4 g/dL (ref 3.5–5.2)
BUN: 11 mg/dL (ref 6–23)
CHLORIDE: 102 meq/L (ref 96–112)
CO2: 29 meq/L (ref 19–32)
Calcium: 9.3 mg/dL (ref 8.4–10.5)
Creatinine, Ser: 0.79 mg/dL (ref 0.40–1.20)
GFR: 93.07 mL/min (ref >60.00)
GLUCOSE: 87 mg/dL (ref 70–99)
POTASSIUM: 3.6 meq/L (ref 3.5–5.1)
Sodium: 137 mEq/L (ref 135–145)
Total Bilirubin: 0.5 mg/dL (ref 0.2–1.2)
Total Protein: 8.1 g/dL (ref 6.0–8.3)

## 2017-10-12 LAB — LIPID PANEL
CHOL/HDL RATIO: 5
Cholesterol: 188 mg/dL (ref 0–200)
HDL: 37.6 mg/dL — AB (ref >39.00)
LDL CALC: 130 mg/dL — AB (ref 0–99)
NONHDL: 150.76
Triglycerides: 105 mg/dL (ref 0.0–149.0)
VLDL: 21 mg/dL (ref 0.0–40.0)

## 2017-10-12 MED ORDER — NORGESTIMATE-ETH ESTRADIOL 0.25-35 MG-MCG PO TABS: 1.0000 | ORAL_TABLET | Freq: Every day | ORAL | 3 refills | Status: DC

## 2017-10-12 NOTE — Assessment & Plan Note (Signed)
Doing well on Celexa, continue same.  

## 2017-10-12 NOTE — Assessment & Plan Note (Addendum)
Tetanus due, she declines today. Pap due, completed today. Recommended to increase vegetables, fruit, whole grains. Recommended regular exercise. Exam unremarkable. Labs pending. Follow up in 1 year.

## 2017-10-12 NOTE — Assessment & Plan Note (Signed)
Overall improved and stable, headaches occurring once monthly. Continue OCP's.

## 2017-10-12 NOTE — Assessment & Plan Note (Signed)
Pap smear completed today.  Refilled OCP's.

## 2017-10-12 NOTE — Patient Instructions (Signed)
Complete lab work prior to leaving today. I will notify you of your results once received.   I sent refills of your birth control to your pharmacy.  You are due for a tetanus vaccination, please come get this done at your convenience.   Start exercising. You should be getting 150 minutes of moderate intensity exercise weekly.  Increase consumption of vegetables, fruit, whole grains.  Ensure you are consuming 64 ounces of water daily.  Follow up in 1 year for your annual exam or sooner if needed.  It was a pleasure to see you today!

## 2017-10-12 NOTE — Progress Notes (Signed)
Subjective:    Patient ID: Thomasene MohairLindsay Knoche, female    DOB: 08/13/1991, 26 y.o.   MRN: [id number]  HPI  [name] is a 26 year old female who presents today for complete physical.  Immunizations: -Tetanus: Completed in 2008. -Influenza: Declines   Diet: She endorses a healthy diet. Breakfast: Yogurt and fruit Lunch: Sandwich, fruit Dinner: Fast food, restaurants  Snacks: Cereal, fruit Desserts: Occasionally  Beverages: Little soda, water  Exercise: She is not currently exercising Eye exam: Completed 2-3 years ago. Dental exam: Due in December Pap Smear: Due today   Review of Systems  Constitutional: Negative for unexpected weight change.  HENT: Negative for rhinorrhea.   Respiratory: Negative for cough and shortness of breath.   Cardiovascular: Negative for chest pain.  Gastrointestinal: Negative for constipation and diarrhea.  Genitourinary: Negative for difficulty urinating and menstrual problem.  Musculoskeletal: Negative for arthralgias and myalgias.  Skin: Negative for rash.  Allergic/Immunologic: Negative for environmental allergies.  Neurological: Negative for dizziness, numbness and headaches.  Psychiatric/Behavioral: Negative for suicidal ideas.       Doing well on Celexa.       Past Medical History:  Diagnosis Date  . Anxiety   . Depression   . History of eating disorder    resolved for two years  . Migraines      Social History   Socioeconomic History  . Marital status: Single    Spouse name: Not on file  . Number of children: Not on file  . Years of education: Not on file  . Highest education level: Not on file  Social Needs  . Financial resource strain: Not on file  . Food insecurity - worry: Not on file  . Food insecurity - inability: Not on file  . Transportation needs - medical: Not on file  . Transportation needs - non-medical: Not on file  Occupational History  . Not on file  Tobacco Use  . Smoking status: Current Every Day  Smoker    Packs/day: 0.25    Years: 6.00    Pack years: 1.50    Types: Cigarettes  . Smokeless tobacco: Never Used  Substance and Sexual Activity  . Alcohol use: Yes    Alcohol/week: 0.0 oz    Comment: weekends  . Drug use: No  . Sexual activity: Yes    Partners: Male    Birth control/protection: Condom  Other Topics Concern  . Not on file  Social History Narrative   Single.   Highest level of education was [employer]   Works with [employer]   Enjoys reading, being outdoors, hula-hoop.    No past surgical history on file.  Family History  Problem Relation Age of Onset  . Thyroid disease Mother   . Diabetes Maternal Grandmother   . Mental illness Paternal Grandmother        dimensia  . Heart disease Paternal Grandfather     No Known Allergies  Current Outpatient Medications on File Prior to Visit  Medication Sig Dispense Refill  . citalopram (CELEXA) 40 MG tablet Take 1 tablet (40 mg total) by mouth daily. 90 tablet 3  . norgestimate-ethinyl estradiol (SPRINTEC 28) 0.25-35 MG-MCG tablet Take 1 tablet by mouth daily. 84 tablet 1  . zinc gluconate 50 MG tablet Take 50 mg by mouth daily.     No current facility-administered medications on file prior to visit.     BP 100/66   Pulse (!) 108   Temp 98.9 F (37.2 C) (  Oral)   Ht 5' (1.524 m)   Wt 105 lb 12.8 oz (48 kg)   SpO2 98%   BMI 20.66 kg/m    Objective:   Physical Exam  Constitutional: She is oriented to person, place, and time. She appears well-nourished.  HENT:  Right Ear: Tympanic membrane and ear canal normal.  Left Ear: Tympanic membrane and ear canal normal.  Nose: Nose normal.  Mouth/Throat: Oropharynx is clear and moist.  Eyes: Conjunctivae and EOM are normal. Pupils are equal, round, and reactive to light.  Neck: Neck supple. No thyromegaly present.  Cardiovascular: Normal rate and regular rhythm.  No murmur heard. Pulmonary/Chest: Effort normal and breath sounds normal. She has no  rales.  Abdominal: Soft. Bowel sounds are normal. There is no tenderness.  Genitourinary: No breast swelling, tenderness or discharge. There is no tenderness or lesion on the right labia. There is no tenderness or lesion on the left labia. Cervix exhibits no motion tenderness and no discharge. Right adnexum displays no tenderness. Left adnexum displays no tenderness. No erythema in the vagina. No vaginal discharge found.  Musculoskeletal: Normal range of motion.  Lymphadenopathy:    She has no cervical adenopathy.  Neurological: She is alert and oriented to person, place, and time. She has normal reflexes. No cranial nerve deficit.  Skin: Skin is warm and dry. No rash noted.  Psychiatric: She has a normal mood and affect.          Assessment & Plan:

## 2017-10-12 NOTE — Addendum Note (Signed)
Addended by: Tawnya CrookSAMBATH, Ellie Bryand on: 10/12/2017 04:20 PM   Modules accepted: Orders

## 2017-10-15 ENCOUNTER — Encounter: Payer: Self-pay | Admitting: Primary Care

## 2017-10-15 LAB — CYTOLOGY - PAP
Adequacy: ABSENT — AB
Diagnosis: UNDETERMINED — AB
HPV: NOT DETECTED

## 2018-04-24 ENCOUNTER — Other Ambulatory Visit: Payer: Self-pay | Admitting: Primary Care

## 2018-04-24 DIAGNOSIS — F411 Generalized anxiety disorder: Secondary | ICD-10-CM

## 2018-09-13 ENCOUNTER — Other Ambulatory Visit: Payer: Self-pay | Admitting: Primary Care

## 2018-09-13 DIAGNOSIS — Z3041 Encounter for surveillance of contraceptive pills: Secondary | ICD-10-CM

## 2018-10-11 ENCOUNTER — Other Ambulatory Visit: Payer: Self-pay | Admitting: Primary Care

## 2018-10-11 DIAGNOSIS — F411 Generalized anxiety disorder: Secondary | ICD-10-CM

## 2018-10-12 ENCOUNTER — Other Ambulatory Visit (HOSPITAL_COMMUNITY)
Admission: RE | Admit: 2018-10-12 | Discharge: 2018-10-12 | Disposition: A | Discharge status: Home / Self Care | Payer: 59 | Source: Ambulatory Visit | Attending: Primary Care | Admitting: Primary Care

## 2018-10-12 ENCOUNTER — Ambulatory Visit: Payer: 59 | Admitting: Primary Care

## 2018-10-12 ENCOUNTER — Encounter: Payer: Self-pay | Admitting: Primary Care

## 2018-10-12 ENCOUNTER — Other Ambulatory Visit: Payer: Self-pay | Admitting: Primary Care

## 2018-10-12 VITALS — BP 110/68 | HR 87 | Temp 98.0°F | Ht 60.0 in | Wt 113.2 lb

## 2018-10-12 DIAGNOSIS — F325 Major depressive disorder, single episode, in full remission: Secondary | ICD-10-CM

## 2018-10-12 DIAGNOSIS — Z3041 Encounter for surveillance of contraceptive pills: Secondary | ICD-10-CM

## 2018-10-12 DIAGNOSIS — R87610 Atypical squamous cells of undetermined significance on cytologic smear of cervix (ASC-US): Secondary | ICD-10-CM | POA: Insufficient documentation

## 2018-10-12 DIAGNOSIS — F411 Generalized anxiety disorder: Secondary | ICD-10-CM

## 2018-10-12 DIAGNOSIS — F419 Anxiety disorder, unspecified: Secondary | ICD-10-CM

## 2018-10-12 DIAGNOSIS — Z124 Encounter for screening for malignant neoplasm of cervix: Secondary | ICD-10-CM

## 2018-10-12 DIAGNOSIS — Z1151 Encounter for screening for human papillomavirus (HPV): Secondary | ICD-10-CM | POA: Diagnosis not present

## 2018-10-12 MED ORDER — NORGESTIMATE-ETH ESTRADIOL 0.25-35 MG-MCG PO TABS: 1.0000 | ORAL_TABLET | Freq: Every day | ORAL | 3 refills | Status: DC

## 2018-10-12 MED ORDER — CITALOPRAM HYDROBROMIDE 40 MG PO TABS: 40.0000 mg | ORAL_TABLET | Freq: Every day | ORAL | 3 refills | Status: DC

## 2018-10-12 NOTE — Assessment & Plan Note (Signed)
Doing well on citalopram 40 mg, continue same. Denies SI/HI. Refills sent to pharmacy.

## 2018-10-12 NOTE — Assessment & Plan Note (Signed)
Doing well on citalopram 40 mg, continue same. Denies SI/HI. Refills sent to pharmacy. 

## 2018-10-12 NOTE — Patient Instructions (Signed)
Please notify me if the pharmacy doesn't have your refills.  We will be in touch regarding your pap smear results.  It was a pleasure to see you today!

## 2018-10-12 NOTE — Assessment & Plan Note (Addendum)
LMP 10/08/18. Doing well on OCP's. Repeat Pap smear completed and pending. Will have her start back her pill pack today with 2 pills, then continue daily.

## 2018-10-12 NOTE — Progress Notes (Signed)
Subjective:    Patient ID: Jenny Watts, female    DOB: Apr 10, 1991, 27 y.o.   MRN: [id number]  HPI  Jenny Watts is a 27 year old female who presents today for medication refill and repeat pap smear.   1) Anxiety and Depression: Currently managed on citalopram 40 mg for which she takes daily. Overall she feels well managed on her current dose. Denies SI/HI.   2) Birth Control: She is compliant to OCP's daily, did run out of her birth control last week and was supposed to restart her pack yesterday. Her LMP began November 8th and finished on November 11th. A three month supply of her OCP's were sent to her pharmacy on 09/14/18, she was told by the pharmacy that this was not the case.   She is due for repeat Pap smear as her last pap was one year ago with ASCUS. She is a smoker of cigarettes.    Review of Systems  Respiratory: Negative for shortness of breath.   Cardiovascular: Negative for chest pain.  Genitourinary: Negative for menstrual problem.  Psychiatric/Behavioral: Negative for suicidal ideas.       See HPI       Past Medical History:  Diagnosis Date  . Anxiety   . Depression   . History of eating disorder    resolved for two years  . Migraines      Social History   Socioeconomic History  . Marital status: Single    Spouse name: Not on file  . Number of children: Not on file  . Years of education: Not on file  . Highest education level: Not on file  Occupational History  . Not on file  Social Needs  . Financial resource strain: Not on file  . Food insecurity:    Worry: Not on file    Inability: Not on file  . Transportation needs:    Medical: Not on file    Non-medical: Not on file  Tobacco Use  . Smoking status: Current Every Day Smoker    Packs/day: 0.25    Years: 6.00    Pack years: 1.50    Types: Cigarettes  . Smokeless tobacco: Never Used  Substance and Sexual Activity  . Alcohol use: Yes    Alcohol/week: 0.0 standard drinks    Comment:  weekends  . Drug use: No  . Sexual activity: Yes    Partners: Male    Birth control/protection: Condom  Lifestyle  . Physical activity:    Days per week: Not on file    Minutes per session: Not on file  . Stress: Not on file  Relationships  . Social connections:    Talks on phone: Not on file    Gets together: Not on file    Attends religious service: Not on file    Active member of club or organization: Not on file    Attends meetings of clubs or organizations: Not on file    Relationship status: Not on file  . Intimate partner violence:    Fear of current or ex partner: Not on file    Emotionally abused: Not on file    Physically abused: Not on file    Forced sexual activity: Not on file  Other Topics Concern  . Not on file  Social History Narrative   Single.   Highest level of education was [employer]   Works with [employer]   Enjoys reading, being outdoors, hula-hoop.    No past surgical  history on file.  Family History  Problem Relation Age of Onset  . Thyroid disease Mother   . Diabetes Maternal Grandmother   . Mental illness Paternal Grandmother        dimensia  . Heart disease Paternal Grandfather     No Known Allergies  Current Outpatient Medications on File Prior to Visit  Medication Sig Dispense Refill  . citalopram (CELEXA) 40 MG tablet TAKE 1 TABLET BY MOUTH DAILY. 90 tablet 1  . norgestimate-ethinyl estradiol (PREVIFEM) 0.25-35 MG-MCG tablet Take 1 tablet by mouth daily. NEED APPOINTMENT FOR ANY MORE REFILLS 3 Package 0   No current facility-administered medications on file prior to visit.     BP 110/68   Pulse 87   Temp 98 F (36.7 C) (Oral)   Ht 5' (1.524 m)   Wt 113 lb 4 oz (51.4 kg)   SpO2 98%   BMI 22.12 kg/m    Objective:   Physical Exam  Constitutional: She appears well-nourished.  Neck: Neck supple.  Cardiovascular: Normal rate and regular rhythm.  Respiratory: Effort normal and breath sounds normal.  Genitourinary:  There is no tenderness or lesion on the right labia. There is no tenderness or lesion on the left labia. Cervix exhibits no motion tenderness and no discharge. Right adnexum displays no tenderness. Left adnexum displays no tenderness. No erythema in the vagina. No vaginal discharge found.  Skin: Skin is warm and dry.  Psychiatric: She has a normal mood and affect.           Assessment & Plan:

## 2018-10-15 LAB — CYTOLOGY - PAP
Diagnosis: UNDETERMINED — AB
HPV (WINDOPATH): NOT DETECTED

## 2019-01-31 DIAGNOSIS — Z3041 Encounter for surveillance of contraceptive pills: Secondary | ICD-10-CM

## 2019-01-31 MED ORDER — NORGESTIMATE-ETH ESTRADIOL 0.25-35 MG-MCG PO TABS: 1.0000 | ORAL_TABLET | Freq: Every day | ORAL | 2 refills | Status: DC

## 2019-11-07 ENCOUNTER — Other Ambulatory Visit: Payer: Self-pay | Admitting: Primary Care

## 2019-11-07 DIAGNOSIS — F411 Generalized anxiety disorder: Secondary | ICD-10-CM

## 2019-11-07 DIAGNOSIS — Z3041 Encounter for surveillance of contraceptive pills: Secondary | ICD-10-CM

## 2019-11-07 MED ORDER — NORGESTIMATE-ETH ESTRADIOL 0.25-35 MG-MCG PO TABS: 1.0000 | ORAL_TABLET | Freq: Every day | ORAL | 0 refills | Status: DC

## 2019-11-07 NOTE — Telephone Encounter (Signed)
Patient left a voicemail stating that she is out of her birth control pills and needs a refill sent in to the pharmacy. Patient stated that the pharmacy has also reached out for this refill.

## 2019-12-05 ENCOUNTER — Other Ambulatory Visit: Payer: Self-pay | Admitting: Primary Care

## 2019-12-05 DIAGNOSIS — F411 Generalized anxiety disorder: Secondary | ICD-10-CM

## 2019-12-12 ENCOUNTER — Ambulatory Visit: Payer: 59 | Attending: Internal Medicine

## 2019-12-12 DIAGNOSIS — Z20822 Contact with and (suspected) exposure to covid-19: Secondary | ICD-10-CM

## 2019-12-13 LAB — NOVEL CORONAVIRUS, NAA: SARS-CoV-2, NAA: NOT DETECTED

## 2020-01-10 ENCOUNTER — Ambulatory Visit: Payer: 59 | Admitting: Primary Care

## 2020-01-10 ENCOUNTER — Telehealth (INDEPENDENT_AMBULATORY_CARE_PROVIDER_SITE_OTHER): Payer: 59 | Admitting: Primary Care

## 2020-01-10 ENCOUNTER — Other Ambulatory Visit: Payer: Self-pay

## 2020-01-10 DIAGNOSIS — F419 Anxiety disorder, unspecified: Secondary | ICD-10-CM

## 2020-01-10 DIAGNOSIS — Z3041 Encounter for surveillance of contraceptive pills: Secondary | ICD-10-CM | POA: Diagnosis not present

## 2020-01-10 DIAGNOSIS — F325 Major depressive disorder, single episode, in full remission: Secondary | ICD-10-CM

## 2020-01-10 NOTE — Progress Notes (Signed)
Subjective:    Patient ID: Jenny Watts, female    DOB: 06/24/1991, 29 y.o.   MRN: [id number]  HPI  Virtual Visit via Video Note  I connected with Jenny Watts on 01/10/20 at  2:20 PM EST by a video enabled telemedicine application and verified that I am speaking with the correct person using two identifiers.  Location: Patient: Home Provider: Office   I discussed the limitations of evaluation and management by telemedicine and the availability of in person appointments. The patient expressed understanding and agreed to proceed.  History of Present Illness:  Jenny Watts is a 29 year old female who presents today for follow up.  1) Anxiety/Depression: Currently managed on citalopram 40 mg. Doing well on her current dose, denies side effects.   2) OCP's: Currently managed on Sprintec 0.25-35 mg-mcg tablets. Her last pap smear was in November of 2019 with ASC-US, negative HPV. She was due for repeat pap smear in November 2020 but due to Covid-19 pandemic did not come in.    Observations/Objective:  Alert and oriented. Appears well, not sickly. No distress. Speaking in complete sentences.   Assessment and Plan:  See problem based charting  Follow Up Instructions:  Schedule a visit sometime this Spring for your repeat Pap smear.  It was a pleasure to see you today! Allie Bossier, NP-C    I discussed the assessment and treatment plan with the patient. The patient was provided an opportunity to ask questions and all were answered. The patient agreed with the plan and demonstrated an understanding of the instructions.   The patient was advised to call back or seek an in-person evaluation if the symptoms worsen or if the condition fails to improve as anticipated.   Pleas Koch, NP    Review of Systems  Respiratory: Negative for shortness of breath.   Cardiovascular: Negative for chest pain.  Neurological: Negative for headaches.  Psychiatric/Behavioral:  Negative for sleep disturbance and suicidal ideas. The patient is not nervous/anxious.        Past Medical History:  Diagnosis Date  . Anxiety   . Depression   . History of eating disorder    resolved for two years  . Migraines      Social History   Socioeconomic History  . Marital status: Single    Spouse name: Not on file  . Number of children: Not on file  . Years of education: Not on file  . Highest education level: Not on file  Occupational History  . Not on file  Tobacco Use  . Smoking status: Current Every Day Smoker    Packs/day: 0.25    Years: 6.00    Pack years: 1.50    Types: Cigarettes  . Smokeless tobacco: Never Used  Substance and Sexual Activity  . Alcohol use: Yes    Alcohol/week: 0.0 standard drinks    Comment: weekends  . Drug use: No  . Sexual activity: Yes    Partners: Male    Birth control/protection: Condom  Other Topics Concern  . Not on file  Social History Narrative   Single.   Highest level of education was [employer]   Works with [employer]   Enjoys reading, being outdoors, hula-hoop.   Social Determinants of Health   Financial Resource Strain:   . Difficulty of Paying Living Expenses: Not on file  Food Insecurity:   . Worried About Charity fundraiser in the Last Year: Not on file  . Ran Out  of Food in the Last Year: Not on file  Transportation Needs:   . Lack of Transportation (Medical): Not on file  . Lack of Transportation (Non-Medical): Not on file  Physical Activity:   . Days of Exercise per Week: Not on file  . Minutes of Exercise per Session: Not on file  Stress:   . Feeling of Stress : Not on file  Social Connections:   . Frequency of Communication with Friends and Family: Not on file  . Frequency of Social Gatherings with Friends and Family: Not on file  . Attends Religious Services: Not on file  . Active Member of Clubs or Organizations: Not on file  . Attends Banker Meetings: Not on file  .  Marital Status: Not on file  Intimate Partner Violence:   . Fear of Current or Ex-Partner: Not on file  . Emotionally Abused: Not on file  . Physically Abused: Not on file  . Sexually Abused: Not on file    No past surgical history on file.  Family History  Problem Relation Age of Onset  . Thyroid disease Mother   . Diabetes Maternal Grandmother   . Mental illness Paternal Grandmother        dimensia  . Heart disease Paternal Grandfather     No Known Allergies  Current Outpatient Medications on File Prior to Visit  Medication Sig Dispense Refill  . citalopram (CELEXA) 40 MG tablet Take 1 tablet (40 mg total) by mouth daily. NEED APPOINTMENT FOR ANY MORE REFILLS 30 tablet 0  . norgestimate-ethinyl estradiol (SPRINTEC 28) 0.25-35 MG-MCG tablet Take 1 tablet by mouth daily. NEED APPOINTMENT FOR ANY MORE REFILLS 1 Package 0   No current facility-administered medications on file prior to visit.    There were no vitals taken for this visit.   Objective:   Physical Exam  Constitutional: She is oriented to person, place, and time. She appears well-nourished.  Respiratory: Effort normal.  Neurological: She is alert and oriented to person, place, and time.  Psychiatric: She has a normal mood and affect.           Assessment & Plan:

## 2020-01-10 NOTE — Assessment & Plan Note (Signed)
Doing well on current OCP regimen. Pap smear overdue, did not come in due to Covid-19 pandemic.  She will come in this Spring to have pap smear done.

## 2020-01-10 NOTE — Assessment & Plan Note (Signed)
Doing well on citalopram 40 mg, continue same.  

## 2020-01-11 ENCOUNTER — Other Ambulatory Visit: Payer: Self-pay | Admitting: Primary Care

## 2020-01-11 DIAGNOSIS — F411 Generalized anxiety disorder: Secondary | ICD-10-CM

## 2020-03-23 ENCOUNTER — Other Ambulatory Visit: Payer: Self-pay | Admitting: Primary Care

## 2020-03-23 DIAGNOSIS — Z3041 Encounter for surveillance of contraceptive pills: Secondary | ICD-10-CM

## 2020-06-18 ENCOUNTER — Other Ambulatory Visit: Payer: Self-pay | Admitting: Primary Care

## 2020-06-18 DIAGNOSIS — F411 Generalized anxiety disorder: Secondary | ICD-10-CM

## 2020-06-18 DIAGNOSIS — Z3041 Encounter for surveillance of contraceptive pills: Secondary | ICD-10-CM

## 2020-09-10 ENCOUNTER — Other Ambulatory Visit: Payer: Self-pay | Admitting: Primary Care

## 2020-09-10 DIAGNOSIS — Z3041 Encounter for surveillance of contraceptive pills: Secondary | ICD-10-CM

## 2020-09-11 ENCOUNTER — Other Ambulatory Visit: Payer: Self-pay

## 2020-09-11 DIAGNOSIS — Z3041 Encounter for surveillance of contraceptive pills: Secondary | ICD-10-CM

## 2020-09-11 MED ORDER — NORGESTIMATE-ETH ESTRADIOL 0.25-35 MG-MCG PO TABS: 1.0000 | ORAL_TABLET | Freq: Every day | ORAL | 2 refills | Status: DC

## 2020-12-04 ENCOUNTER — Other Ambulatory Visit: Payer: Self-pay | Admitting: Primary Care

## 2020-12-04 DIAGNOSIS — Z3041 Encounter for surveillance of contraceptive pills: Secondary | ICD-10-CM

## 2020-12-04 DIAGNOSIS — F411 Generalized anxiety disorder: Secondary | ICD-10-CM

## 2021-01-28 ENCOUNTER — Other Ambulatory Visit: Payer: Self-pay | Admitting: Primary Care

## 2021-01-28 DIAGNOSIS — F411 Generalized anxiety disorder: Secondary | ICD-10-CM

## 2021-02-05 ENCOUNTER — Other Ambulatory Visit (HOSPITAL_COMMUNITY)
Admission: RE | Admit: 2021-02-05 | Discharge: 2021-02-05 | Disposition: A | Discharge status: Home / Self Care | Payer: 59 | Source: Ambulatory Visit | Attending: Primary Care | Admitting: Primary Care

## 2021-02-05 ENCOUNTER — Ambulatory Visit (INDEPENDENT_AMBULATORY_CARE_PROVIDER_SITE_OTHER): Payer: 59 | Admitting: Primary Care

## 2021-02-05 ENCOUNTER — Other Ambulatory Visit: Payer: Self-pay

## 2021-02-05 ENCOUNTER — Encounter: Payer: Self-pay | Admitting: Primary Care

## 2021-02-05 VITALS — BP 108/70 | HR 93 | Temp 98.4°F | Ht 61.25 in | Wt 115.5 lb

## 2021-02-05 DIAGNOSIS — Z113 Encounter for screening for infections with a predominantly sexual mode of transmission: Secondary | ICD-10-CM | POA: Insufficient documentation

## 2021-02-05 DIAGNOSIS — F325 Major depressive disorder, single episode, in full remission: Secondary | ICD-10-CM

## 2021-02-05 DIAGNOSIS — Z0000 Encounter for general adult medical examination without abnormal findings: Secondary | ICD-10-CM

## 2021-02-05 DIAGNOSIS — Z124 Encounter for screening for malignant neoplasm of cervix: Secondary | ICD-10-CM

## 2021-02-05 DIAGNOSIS — Z1159 Encounter for screening for other viral diseases: Secondary | ICD-10-CM

## 2021-02-05 DIAGNOSIS — F411 Generalized anxiety disorder: Secondary | ICD-10-CM | POA: Diagnosis not present

## 2021-02-05 DIAGNOSIS — Z1151 Encounter for screening for human papillomavirus (HPV): Secondary | ICD-10-CM | POA: Insufficient documentation

## 2021-02-05 DIAGNOSIS — F419 Anxiety disorder, unspecified: Secondary | ICD-10-CM

## 2021-02-05 DIAGNOSIS — Z3041 Encounter for surveillance of contraceptive pills: Secondary | ICD-10-CM

## 2021-02-05 LAB — LIPID PANEL
Cholesterol: 153 mg/dL (ref 0–200)
HDL: 40.1 mg/dL (ref >39.00)
LDL Cholesterol: 86 mg/dL (ref 0–99)
NonHDL: 112.78
Total CHOL/HDL Ratio: 4
Triglycerides: 136 mg/dL (ref 0.0–149.0)
VLDL: 27.2 mg/dL (ref 0.0–40.0)

## 2021-02-05 LAB — COMPREHENSIVE METABOLIC PANEL
ALT: 7 U/L (ref 0–35)
AST: 15 U/L (ref 0–37)
Albumin: 4 g/dL (ref 3.5–5.2)
Alkaline Phosphatase: 50 U/L (ref 39–117)
BUN: 11 mg/dL (ref 6–23)
CO2: 30 mEq/L (ref 19–32)
Calcium: 9 mg/dL (ref 8.4–10.5)
Chloride: 104 mEq/L (ref 96–112)
Creatinine, Ser: 0.78 mg/dL (ref 0.40–1.20)
GFR: 102.29 mL/min (ref >60.00)
Glucose, Bld: 93 mg/dL (ref 70–99)
Potassium: 3.7 mEq/L (ref 3.5–5.1)
Sodium: 140 mEq/L (ref 135–145)
Total Bilirubin: 0.4 mg/dL (ref 0.2–1.2)
Total Protein: 6.8 g/dL (ref 6.0–8.3)

## 2021-02-05 LAB — CBC
HCT: 36.1 % (ref 36.0–46.0)
Hemoglobin: 12.5 g/dL (ref 12.0–15.0)
MCHC: 34.6 g/dL (ref 30.0–36.0)
MCV: 91.9 fl (ref 78.0–100.0)
Platelets: 322 10*3/uL (ref 150.0–400.0)
RBC: 3.92 Mil/uL (ref 3.87–5.11)
RDW: 11.8 % (ref 11.5–15.5)
WBC: 7.1 10*3/uL (ref 4.0–10.5)

## 2021-02-05 MED ORDER — CITALOPRAM HYDROBROMIDE 40 MG PO TABS: 40.0000 mg | ORAL_TABLET | Freq: Every day | ORAL | 3 refills | Status: DC

## 2021-02-05 MED ORDER — NORGESTIMATE-ETH ESTRADIOL 0.25-35 MG-MCG PO TABS: 1.0000 | ORAL_TABLET | Freq: Every day | ORAL | 3 refills | Status: DC

## 2021-02-05 NOTE — Patient Instructions (Signed)
Stop by the lab prior to leaving today. I will notify you of your results once received.   Start exercising. You should be getting 150 minutes of moderate intensity exercise weekly.  Be sure to work on a healthy diet. Ensure you are consuming 64 ounces of water daily.  It was a pleasure to see you today!   Preventive Care 18-30 Years Old, Female Preventive care refers to lifestyle choices and visits with your health care provider that can promote health and wellness. This includes:  A yearly physical exam. This is also called an annual wellness visit.  Regular dental and eye exams.  Immunizations.  Screening for certain conditions.  Healthy lifestyle choices, such as: ? Eating a healthy diet. ? Getting regular exercise. ? Not using drugs or products that contain nicotine and tobacco. ? Limiting alcohol use. What can I expect for my preventive care visit? Physical exam Your health care provider may check your:  Height and weight. These may be used to calculate your BMI (body mass index). BMI is a measurement that tells if you are at a healthy weight.  Heart rate and blood pressure.  Body temperature.  Skin for abnormal spots. Counseling Your health care provider may ask you questions about your:  Past medical problems.  Family's medical history.  Alcohol, tobacco, and drug use.  Emotional well-being.  Home life and relationship well-being.  Sexual activity.  Diet, exercise, and sleep habits.  Work and work Statistician.  Access to firearms.  Method of birth control.  Menstrual cycle.  Pregnancy history. What immunizations do I need? Vaccines are usually given at various ages, according to a schedule. Your health care provider will recommend vaccines for you based on your age, medical history, and lifestyle or other factors, such as travel or where you work.   What tests do I need? Blood tests  Lipid and cholesterol levels. These may be checked every 5  years starting at age 30.  Hepatitis C test.  Hepatitis B test. Screening  Diabetes screening. This is done by checking your blood sugar (glucose) after you have not eaten for a while (fasting).  STD (sexually transmitted disease) testing, if you are at risk.  BRCA-related cancer screening. This may be done if you have a family history of breast, ovarian, tubal, or peritoneal cancers.  Pelvic exam and Pap test. This may be done every 3 years starting at age 7. Starting at age 20, this may be done every 5 years if you have a Pap test in combination with an HPV test. Talk with your health care provider about your test results, treatment options, and if necessary, the need for more tests.   Follow these instructions at home: Eating and drinking  Eat a healthy diet that includes fresh fruits and vegetables, whole grains, lean protein, and low-fat dairy products.  Take vitamin and mineral supplements as recommended by your health care provider.  Do not drink alcohol if: ? Your health care provider tells you not to drink. ? You are pregnant, may be pregnant, or are planning to become pregnant.  If you drink alcohol: ? Limit how much you have to 0-1 drink a day. ? Be aware of how much alcohol is in your drink. In the [place] one drink equals one 12 oz bottle of beer (355 mL), one 5 oz glass of wine (148 mL), or one 1 oz glass of hard liquor (44 mL).   Lifestyle  Take daily care of your teeth and gums.  Brush your teeth every morning and night with fluoride toothpaste. Floss one time each day.  Stay active. Exercise for at least 30 minutes 5 or more days each week.  Do not use any products that contain nicotine or tobacco, such as cigarettes, e-cigarettes, and chewing tobacco. If you need help quitting, ask your health care provider.  Do not use drugs.  If you are sexually active, practice safe sex. Use a condom or other form of protection to prevent STIs (sexually transmitted  infections).  If you do not wish to become pregnant, use a form of birth control. If you plan to become pregnant, see your health care provider for a prepregnancy visit.  Find healthy ways to cope with stress, such as: ? Meditation, yoga, or listening to music. ? Journaling. ? Talking to a trusted person. ? Spending time with friends and family. Safety  Always wear your seat belt while driving or riding in a vehicle.  Do not drive: ? If you have been drinking alcohol. Do not ride with someone who has been drinking. ? When you are tired or distracted. ? While texting.  Wear a helmet and other protective equipment during sports activities.  If you have firearms in your house, make sure you follow all gun safety procedures.  Seek help if you have been physically or sexually abused. What's next?  Go to your health care provider once a year for an annual wellness visit.  Ask your health care provider how often you should have your eyes and teeth checked.  Stay up to date on all vaccines. This information is not intended to replace advice given to you by your health care provider. Make sure you discuss any questions you have with your health care provider. Document Revised: 07/15/2020 Document Reviewed: 07/29/2018 Elsevier Patient Education  2021 [employer].

## 2021-02-05 NOTE — Assessment & Plan Note (Signed)
Feels well managed on citalopram 40 mg, continues same.  Refills sent pharmacy

## 2021-02-05 NOTE — Assessment & Plan Note (Signed)
Tetanus vaccine due, declines today. Also declines influenza vaccine and COVID vaccines.  Pap smear due, completed today.  Encouraged healthy diet and regular exercise. Exam today unremarkable. Labs pending.

## 2021-02-05 NOTE — Assessment & Plan Note (Signed)
Menstrual cycle significantly improved with OCPs. Continue same, refill sent to pharmacy.

## 2021-02-05 NOTE — Assessment & Plan Note (Signed)
Doing well on citalopram 40 mg, continue same. Refills sent to pharmacy.

## 2021-02-05 NOTE — Progress Notes (Signed)
Subjective:    Patient ID: Jenny Watts, female    DOB: 02-20-1991, 30 y.o.   MRN: [id number]  HPI  This visit occurred during the SARS-CoV-2 public health emergency.  Safety protocols were in place, including screening questions prior to the visit, additional usage of staff PPE, and extensive cleaning of exam room while observing appropriate contact time as indicated for disinfecting solutions.   Jenny Watts is a 30 year old female who presents today for complete physical.  Immunizations: -Tetanus: 2008 -Influenza: Did not complete last season  -Covid-19: Has not completed  Diet: She endorses a fair diet.  Exercise: She is not exercising   Eye exam: No recent exam Dental exam: Completed   Pap Smear: 2019, ASC-US, due for repeat Hep C Screen: Due  BP Readings from Last 3 Encounters:  02/05/21 108/70  10/12/18 110/68  10/12/17 100/66     Review of Systems  Constitutional: Negative for unexpected weight change.  HENT: Negative for rhinorrhea.   Eyes: Negative for visual disturbance.  Respiratory: Negative for cough and shortness of breath.   Cardiovascular: Negative for chest pain.  Gastrointestinal: Negative for constipation and diarrhea.  Genitourinary: Negative for difficulty urinating and menstrual problem.  Musculoskeletal: Negative for arthralgias.  Skin: Negative for rash.  Allergic/Immunologic: Negative for environmental allergies.  Neurological: Negative for dizziness and headaches.  Psychiatric/Behavioral: The patient is not nervous/anxious.        Past Medical History:  Diagnosis Date  . Anxiety   . Depression   . History of eating disorder    resolved for two years  . Migraines      Social History   Socioeconomic History  . Marital status: Single    Spouse name: Not on file  . Number of children: Not on file  . Years of education: Not on file  . Highest education level: Not on file  Occupational History  . Not on file  Tobacco Use   . Smoking status: Current Every Day Smoker    Packs/day: 0.25    Years: 6.00    Pack years: 1.50    Types: Cigarettes  . Smokeless tobacco: Never Used  Substance and Sexual Activity  . Alcohol use: Yes    Alcohol/week: 0.0 standard drinks    Comment: weekends  . Drug use: No  . Sexual activity: Yes    Partners: Male    Birth control/protection: Condom  Other Topics Concern  . Not on file  Social History Narrative   Single.   Highest level of education was [employer]   Works with Customer Service   Enjoys reading, being outdoors, hula-hoop.   Social Determinants of Health   Financial Resource Strain: Not on file  Food Insecurity: Not on file  Transportation Needs: Not on file  Physical Activity: Not on file  Stress: Not on file  Social Connections: Not on file  Intimate Partner Violence: Not on file    History reviewed. No pertinent surgical history.  Family History  Problem Relation Age of Onset  . Thyroid disease Mother   . Diabetes Maternal Grandmother   . Mental illness Paternal Grandmother        dimensia  . Heart disease Paternal Grandfather     No Known Allergies  Current Outpatient Medications on File Prior to Visit  Medication Sig Dispense Refill  . citalopram (CELEXA) 40 MG tablet TAKE 1 TABLET BY MOUTH DAILY. 30 tablet 0  . SPRINTEC 28 0.25-35 MG-MCG tablet TAKE 1 TABLET BY MOUTH  DAILY. 28 tablet 2   No current facility-administered medications on file prior to visit.    BP 108/70   Pulse 93   Temp 98.4 F (36.9 C) (Temporal)   Ht 5' 1.25" (1.556 m)   Wt 115 lb 8 oz (52.4 kg)   SpO2 99%   BMI 21.65 kg/m    Objective:   Physical Exam Constitutional:      Appearance: She is well-nourished.  HENT:     Right Ear: Tympanic membrane and ear canal normal.     Left Ear: Tympanic membrane and ear canal normal.     Mouth/Throat:     Mouth: Oropharynx is clear and moist.  Eyes:     Extraocular Movements: EOM normal.     Pupils: Pupils are  equal, round, and reactive to light.  Cardiovascular:     Rate and Rhythm: Normal rate and regular rhythm.  Pulmonary:     Effort: Pulmonary effort is normal.     Breath sounds: Normal breath sounds.  Abdominal:     General: Bowel sounds are normal.     Palpations: Abdomen is soft.     Tenderness: There is no abdominal tenderness.  Genitourinary:    Labia:        Right: No tenderness or lesion.        Left: No tenderness or lesion.      Cervix: No cervical motion tenderness or discharge.     Uterus: Normal.      Adnexa: Right adnexa normal and left adnexa normal.  Musculoskeletal:        General: Normal range of motion.     Cervical back: Neck supple.  Skin:    General: Skin is warm and dry.  Neurological:     Mental Status: She is alert and oriented to person, place, and time.     Cranial Nerves: No cranial nerve deficit.     Deep Tendon Reflexes:     Reflex Scores:      Patellar reflexes are 2+ on the right side and 2+ on the left side. Psychiatric:        Mood and Affect: Mood and affect and mood normal.            Assessment & Plan:

## 2021-02-06 LAB — CYTOLOGY - PAP
Adequacy: ABSENT
Chlamydia: NEGATIVE
Comment: NEGATIVE
Comment: NEGATIVE
Comment: NEGATIVE
Comment: NORMAL
Diagnosis: NEGATIVE
High risk HPV: NEGATIVE
Neisseria Gonorrhea: NEGATIVE
Trichomonas: NEGATIVE

## 2021-02-06 LAB — HEPATITIS C ANTIBODY
Hepatitis C Ab: NONREACTIVE
SIGNAL TO CUT-OFF: 0.01 (ref <1.00)

## 2022-01-22 ENCOUNTER — Other Ambulatory Visit: Payer: Self-pay | Admitting: Primary Care

## 2022-01-22 DIAGNOSIS — Z3041 Encounter for surveillance of contraceptive pills: Secondary | ICD-10-CM

## 2022-01-22 DIAGNOSIS — F411 Generalized anxiety disorder: Secondary | ICD-10-CM
